# Patient Record
Sex: Female | Born: 1990 | Race: White | Marital: Married | Smoking: Never smoker
Health system: Southern US, Community
[De-identification: ages and names within clinical notes are randomized; demographics above are authoritative.]

## PROBLEM LIST (undated history)

## (undated) HISTORY — PX: WISDOM TOOTH EXTRACTION: SHX21

---

## 2018-12-25 ENCOUNTER — Other Ambulatory Visit: Payer: Self-pay | Admitting: Obstetrics and Gynecology

## 2018-12-25 DIAGNOSIS — Z369 Encounter for antenatal screening, unspecified: Secondary | ICD-10-CM

## 2018-12-26 ENCOUNTER — Other Ambulatory Visit: Payer: Self-pay

## 2018-12-26 ENCOUNTER — Telehealth: Payer: Self-pay | Admitting: Obstetrics and Gynecology

## 2018-12-26 DIAGNOSIS — O30039 Twin pregnancy, monochorionic/diamniotic, unspecified trimester: Secondary | ICD-10-CM

## 2018-12-26 NOTE — Telephone Encounter (Signed)
I spoke with Tracey Logan about her visit to [facility] scheduled for Monday, 12/30/2018.  She was referred for genetic counseling to review screening and testing options followed by an ultrasound and MFM consultation due to the finding of mono/di twins on her recent ultrasound.  She stated that she and her partner have spoken about the genetic testing and do not want to pursue any screening.  She also denied any family history or pregnancy history concerns that she would like to discuss.  Therefore, she asked to decline the genetic counseling visit and just come in for the ultrasound and MFM visit.  We may be reached at [phone] if anything changes.  Wilburt Finlay, MS, CGC

## 2018-12-30 ENCOUNTER — Ambulatory Visit
Admission: RE | Admit: 2018-12-30 | Discharge: 2018-12-30 | Disposition: A | Discharge status: Home / Self Care | Payer: BC Managed Care – PPO | Source: Ambulatory Visit | Attending: Obstetrics and Gynecology | Admitting: Obstetrics and Gynecology

## 2018-12-30 ENCOUNTER — Other Ambulatory Visit: Payer: Self-pay

## 2018-12-30 ENCOUNTER — Ambulatory Visit: Payer: Self-pay

## 2018-12-30 DIAGNOSIS — Z3A09 9 weeks gestation of pregnancy: Secondary | ICD-10-CM | POA: Insufficient documentation

## 2018-12-30 DIAGNOSIS — O30031 Twin pregnancy, monochorionic/diamniotic, first trimester: Secondary | ICD-10-CM | POA: Diagnosis present

## 2018-12-30 DIAGNOSIS — O210 Mild hyperemesis gravidarum: Secondary | ICD-10-CM | POA: Diagnosis not present

## 2018-12-30 DIAGNOSIS — O30039 Twin pregnancy, monochorionic/diamniotic, unspecified trimester: Secondary | ICD-10-CM | POA: Insufficient documentation

## 2018-12-30 DIAGNOSIS — Z8759 Personal history of other complications of pregnancy, childbirth and the puerperium: Secondary | ICD-10-CM | POA: Insufficient documentation

## 2018-12-30 NOTE — Progress Notes (Signed)
[facility] Consultation   Chief Complaint: MO/Di Twins   HPI: Ms. Tracey Logan is a 28 y.o. G3P0020 at 9 weeks 2 days by earliest ultrasound performed [facility] on 12/19/2018 at 7 weeks 5 days by the larger crown-rump length, her last menstrual period was irregular as she had had a recent miscarriage. She  presents in consultation from  Prenatal clinic due to her diagnosis of monochorionic diamniotic twin gestation. 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  She reports that she is felt well except for some nausea in the evening for which she has been using doxylamine. She loves horses and riding and participates in mounted shooting as a sport.   She is taking supplemental vitamins and folic acid. She is interested in nutrition during pregnancy as she advises farms on animal nutrition.    Past Medical History: Patient  has no past medical history on file.  Patient denies diabetes hypertension or asthma Past Surgical History: She  has a past surgical history that includes Wisdom tooth extraction (Bilateral).  Obstetric History:  Gravida 3  1-SAB at 6 weeks required misoprostol 2 SAB at 5 weeks in June 2020 Gynecologic History:  Patient's last menstrual period was 10/22/2018.    Medications: She @CMEDP @  Allergies: Patient has No Known Allergies.  Social History: Patient  reports that she has never smoked. She has never used smokeless tobacco. She reports that she does not drink alcohol or use drugs.  Family History: family history is not on file.  Patient states her parents are alive and well she has a sister who has no children Review of Systems mild nausea in the evening Physical Exam: LMP 10/22/2018  Height is 5 foot 8 Weight 186  pounds Blood pressure 111/55 Pulse is 80 Respiratory rate 20 Pulse ox 100  Well appearing WF   Asessement: Mono/ di twins at 9 weeks 2 days History of SAB x2 Mild nausea on doxylamine Declines genetic screening Plan: I reviewed the risk of twins in general including: -Prematurity-I reviewed that there are no evidence-based interventions for preterm birth for twins. I Told the risk of delivery prior to 37 weeks was at least 20%. -Fetal growth restriction-told her the risk was increased and that the pregnancy would need monitoring with ultrasound of the third trimester to monitor fetal growth. -Hyperemesis I advised the patient that if the doxylamine is not working she could use Phenergan .  Zofran is less preferred alternative because of some data suggesting slight increased risk of birth defects, but if she experiences metabolic derangement then reasonable to use Zofran. -Preeclampsia-I advised the patient to initiate a daily baby aspirin 11- 14 weeks due to increased risk of preeclampsia with twins -Gestational diabetes-patient is mildly overweight but has no family history of diabetes I recommended a routine Glucola screening test at 28 weeks.  I reviewed specific risks of monozygotic twinning with monochorionic/diamniotic placentation -I reviewed the concerns with twin to twin transfusion syndrome and the high risk of fetal morbidity and mortality associated with it.  I recommended every 2-week ultrasound starting at 12 weeks alternating with growth every 4 weeks.  I reviewed the challenges of intervention for his complication -Higher risk of congenital anomalies-recommended detailed ultrasound at 18 weeks, with  Covid we have not been routinely performing fetal echo there is any challenging imaging the heart I would suggest this. -Higher risk of IUFD-at 32 weeks I recommend weekly monitoring with delivery at 37 weeks. We briefly discussed methods of delivery depending on presentation and  status of the fetuses.   Total time spent with the patient was 30 minutes with greater than 50% spent in counseling and coordination of care. We appreciate this interesting consult and will be happy to be involved in the ongoing care of Tracey Logan in anyway her obstetricians desire.  Jacquis Paxton, [facility]

## 2019-01-08 LAB — OB RESULTS CONSOLE GC/CHLAMYDIA
Chlamydia: NEGATIVE
Gonorrhea: NEGATIVE

## 2019-01-08 LAB — OB RESULTS CONSOLE VARICELLA ZOSTER ANTIBODY, IGG: Varicella: IMMUNE

## 2019-01-08 LAB — OB RESULTS CONSOLE RUBELLA ANTIBODY, IGM: Rubella: IMMUNE

## 2019-01-08 LAB — OB RESULTS CONSOLE HEPATITIS B SURFACE ANTIGEN: Hepatitis B Surface Ag: NEGATIVE

## 2019-01-08 LAB — OB RESULTS CONSOLE HIV ANTIBODY (ROUTINE TESTING): HIV: NONREACTIVE

## 2019-01-14 ENCOUNTER — Other Ambulatory Visit: Payer: Self-pay | Admitting: Obstetrics and Gynecology

## 2019-01-14 DIAGNOSIS — O30001 Twin pregnancy, unspecified number of placenta and unspecified number of amniotic sacs, first trimester: Secondary | ICD-10-CM

## 2019-01-16 ENCOUNTER — Ambulatory Visit: Payer: Self-pay

## 2019-01-27 ENCOUNTER — Ambulatory Visit
Admission: RE | Admit: 2019-01-27 | Discharge: 2019-01-27 | Disposition: A | Discharge status: Home / Self Care | Payer: BC Managed Care – PPO | Source: Ambulatory Visit | Attending: Obstetrics and Gynecology | Admitting: Obstetrics and Gynecology

## 2019-01-27 ENCOUNTER — Other Ambulatory Visit: Payer: Self-pay

## 2019-01-27 DIAGNOSIS — O30001 Twin pregnancy, unspecified number of placenta and unspecified number of amniotic sacs, first trimester: Secondary | ICD-10-CM | POA: Diagnosis not present

## 2019-01-27 DIAGNOSIS — Z369 Encounter for antenatal screening, unspecified: Secondary | ICD-10-CM | POA: Insufficient documentation

## 2019-01-27 DIAGNOSIS — Z3A13 13 weeks gestation of pregnancy: Secondary | ICD-10-CM | POA: Diagnosis not present

## 2019-01-27 NOTE — Progress Notes (Signed)
Pt declined to return for TTTS checks at [facility] - she will have u/s done at [facility] - more convenient and better support for her.   I scheduled her for her anatomy scan a t [facility] .  Gatha Mayer

## 2019-03-07 NOTE — L&D Delivery Note (Signed)
Operative Delivery Note At  a viable female x 2 was delivered via kielland forceps + svd .  Presentation: VTX/ VTX  Presentation  BAby A - LOT , Baby B -AO ; Position: ; Station: +3/3  Verbal consent: obtained from patient.  Risks and benefits discussed in detail.  Risks include, but are not limited to the risks of anesthesia, bleeding, infection, damage to maternal tissues, fetal cephalhematoma.  There is also the risk of inability to effect vaginal delivery of the head, or shoulder dystocia that cannot be resolved by established maneuvers, leading to the need for emergency cesarean section. Baby A delivered via ILF kielland with 90 % rotation  Baby B after 15-20 minutes AROM with copious amniotic fluid and manual reduction of fetal hand to side of head . Pushing allowed head to be only presenting part . SVD by [facility], CNM with delivery of placentas manually. Uterine swipe without retained POC . 1 gm IV ancef for prophylaxis . Second degree repair done after rectal exam to ensure no capsule rupture or rectal mucosal injury   See dictated report  APGAR:A Baby A: 8/9  Baby B : apgars 8/9 , ; weightA: #6/4 , B: #5/8  .   Placenta status: Manual extraction - intact , .   Cord: #v x 2  with the following complications: None .   Anesthesia:  CLE + lidocaine  Instruments: Kielland forceps  Episiotomy:  none Lacerations:  second  Suture Repair: 2.0 3.0 vicryl Est. Blood Loss (mL):  600 cc  Mom to postpartum.  Baby to Couplet care / Skin to Skin.  Tracey Logan 07/14/2019, 5:19 PM

## 2019-05-19 ENCOUNTER — Other Ambulatory Visit: Payer: Self-pay | Admitting: Maternal & Fetal Medicine

## 2019-05-19 ENCOUNTER — Other Ambulatory Visit: Payer: Self-pay

## 2019-05-19 ENCOUNTER — Ambulatory Visit
Admission: RE | Admit: 2019-05-19 | Discharge: 2019-05-19 | Disposition: A | Discharge status: Home / Self Care | Payer: BC Managed Care – PPO | Source: Ambulatory Visit | Attending: Maternal & Fetal Medicine | Admitting: Maternal & Fetal Medicine

## 2019-05-19 DIAGNOSIS — Z3A29 29 weeks gestation of pregnancy: Secondary | ICD-10-CM | POA: Diagnosis not present

## 2019-05-19 DIAGNOSIS — O30002 Twin pregnancy, unspecified number of placenta and unspecified number of amniotic sacs, second trimester: Secondary | ICD-10-CM

## 2019-05-29 ENCOUNTER — Other Ambulatory Visit: Payer: Self-pay | Admitting: Maternal & Fetal Medicine

## 2019-05-29 DIAGNOSIS — O30033 Twin pregnancy, monochorionic/diamniotic, third trimester: Secondary | ICD-10-CM

## 2019-06-02 ENCOUNTER — Ambulatory Visit
Admission: RE | Admit: 2019-06-02 | Discharge: 2019-06-02 | Disposition: A | Discharge status: Home / Self Care | Payer: BC Managed Care – PPO | Source: Ambulatory Visit | Attending: Obstetrics and Gynecology | Admitting: Obstetrics and Gynecology

## 2019-06-02 ENCOUNTER — Other Ambulatory Visit: Payer: Self-pay | Admitting: Maternal & Fetal Medicine

## 2019-06-02 ENCOUNTER — Other Ambulatory Visit: Payer: Self-pay

## 2019-06-02 ENCOUNTER — Other Ambulatory Visit: Payer: Self-pay | Admitting: Obstetrics and Gynecology

## 2019-06-02 DIAGNOSIS — O30033 Twin pregnancy, monochorionic/diamniotic, third trimester: Secondary | ICD-10-CM

## 2019-06-02 DIAGNOSIS — Z3A00 Weeks of gestation of pregnancy not specified: Secondary | ICD-10-CM | POA: Diagnosis not present

## 2019-06-09 ENCOUNTER — Other Ambulatory Visit: Payer: Self-pay

## 2019-06-09 ENCOUNTER — Other Ambulatory Visit: Payer: Self-pay | Admitting: Maternal & Fetal Medicine

## 2019-06-09 ENCOUNTER — Ambulatory Visit
Admission: RE | Admit: 2019-06-09 | Discharge: 2019-06-09 | Disposition: A | Discharge status: Home / Self Care | Payer: BC Managed Care – PPO | Source: Ambulatory Visit | Attending: Maternal & Fetal Medicine | Admitting: Maternal & Fetal Medicine

## 2019-06-09 DIAGNOSIS — O30033 Twin pregnancy, monochorionic/diamniotic, third trimester: Secondary | ICD-10-CM

## 2019-06-09 DIAGNOSIS — Z3A32 32 weeks gestation of pregnancy: Secondary | ICD-10-CM | POA: Diagnosis not present

## 2019-06-16 ENCOUNTER — Other Ambulatory Visit: Payer: Self-pay

## 2019-06-16 ENCOUNTER — Ambulatory Visit
Admission: RE | Admit: 2019-06-16 | Discharge: 2019-06-16 | Disposition: A | Discharge status: Home / Self Care | Payer: BC Managed Care – PPO | Source: Ambulatory Visit | Attending: Obstetrics and Gynecology | Admitting: Obstetrics and Gynecology

## 2019-06-16 ENCOUNTER — Other Ambulatory Visit: Payer: Self-pay | Admitting: Obstetrics and Gynecology

## 2019-06-16 DIAGNOSIS — Z3A33 33 weeks gestation of pregnancy: Secondary | ICD-10-CM | POA: Diagnosis not present

## 2019-06-16 DIAGNOSIS — O365990 Maternal care for other known or suspected poor fetal growth, unspecified trimester, not applicable or unspecified: Secondary | ICD-10-CM | POA: Diagnosis present

## 2019-06-16 DIAGNOSIS — O30033 Twin pregnancy, monochorionic/diamniotic, third trimester: Secondary | ICD-10-CM | POA: Insufficient documentation

## 2019-06-16 DIAGNOSIS — O30039 Twin pregnancy, monochorionic/diamniotic, unspecified trimester: Secondary | ICD-10-CM | POA: Diagnosis present

## 2019-06-19 ENCOUNTER — Other Ambulatory Visit: Payer: Self-pay | Admitting: Obstetrics and Gynecology

## 2019-06-19 DIAGNOSIS — O30033 Twin pregnancy, monochorionic/diamniotic, third trimester: Secondary | ICD-10-CM

## 2019-06-23 ENCOUNTER — Ambulatory Visit
Admission: RE | Admit: 2019-06-23 | Discharge: 2019-06-23 | Disposition: A | Discharge status: Home / Self Care | Payer: BC Managed Care – PPO | Source: Ambulatory Visit | Attending: Obstetrics and Gynecology | Admitting: Obstetrics and Gynecology

## 2019-06-23 ENCOUNTER — Other Ambulatory Visit: Payer: Self-pay

## 2019-06-23 DIAGNOSIS — O30033 Twin pregnancy, monochorionic/diamniotic, third trimester: Secondary | ICD-10-CM | POA: Insufficient documentation

## 2019-06-23 DIAGNOSIS — Z3A34 34 weeks gestation of pregnancy: Secondary | ICD-10-CM | POA: Insufficient documentation

## 2019-06-25 ENCOUNTER — Other Ambulatory Visit: Payer: Self-pay | Admitting: Maternal & Fetal Medicine

## 2019-06-25 ENCOUNTER — Observation Stay
Admission: RE | Admit: 2019-06-25 | Discharge: 2019-06-25 | Disposition: A | Discharge status: Home / Self Care | Payer: BC Managed Care – PPO | Attending: Obstetrics and Gynecology | Admitting: Obstetrics and Gynecology

## 2019-06-25 ENCOUNTER — Encounter: Payer: Self-pay | Admitting: Obstetrics and Gynecology

## 2019-06-25 DIAGNOSIS — O0990 Supervision of high risk pregnancy, unspecified, unspecified trimester: Secondary | ICD-10-CM | POA: Diagnosis not present

## 2019-06-25 DIAGNOSIS — O30033 Twin pregnancy, monochorionic/diamniotic, third trimester: Secondary | ICD-10-CM

## 2019-06-25 DIAGNOSIS — Z3A34 34 weeks gestation of pregnancy: Secondary | ICD-10-CM | POA: Insufficient documentation

## 2019-06-25 DIAGNOSIS — Z79899 Other long term (current) drug therapy: Secondary | ICD-10-CM | POA: Insufficient documentation

## 2019-06-25 LAB — OB RESULTS CONSOLE RPR: RPR: NONREACTIVE

## 2019-06-25 LAB — OB RESULTS CONSOLE GBS: GBS: NEGATIVE

## 2019-06-25 NOTE — OB Triage Note (Signed)
Patient presents to labor and delivery for scheduled NST. Patient reports no contractions, leaking of fluid, and states she has good fetal movement of both babies. Vital signs are stable. CNM aware of patient arrival and in to assess patient.

## 2019-06-25 NOTE — Discharge Summary (Signed)
Tracey Logan is a 29 y.o. female. She is at [redacted]w[redacted]d gestation. Patient's last menstrual period was 10/22/2018. Estimated Date of Delivery: 08/06/19   Prenatal care site: [facility]   Chief Complaint: high risk pregnancy, need for antepartum surveillance for Mono-Di twin pregnancy  S: Resting comfortably. no CTX, no VB.no LOF,  Active fetal movement.    Maternal Medical History:  No past medical history on file.  Past Surgical History:  Procedure Laterality Date  . WISDOM TOOTH EXTRACTION Bilateral     No Known Allergies  Prior to Admission medications   Medication Sig Start Date End Date Taking? Authorizing Provider  Ascorbic Acid (VITAMIN C) 1000 MG tablet Take 1,000 mg by mouth daily.    [provider]  docusate sodium (COLACE) 100 MG capsule Take 100 mg by mouth daily.    [provider]  ferrous sulfate 325 (65 FE) MG tablet Take 325 mg by mouth daily with breakfast.    [provider]  Prenatal Vit-Fe Fumarate-FA (PRENATAL MULTIVITAMIN) TABS tablet Take 2 tablets by mouth daily at 12 noon.    [provider]     Social History: She  reports that she has never smoked. She has never used smokeless tobacco. She reports that she does not drink alcohol or use drugs.  Family History:  no history of gyn cancers  Review of Systems: A full review of systems was performed and negative except as noted in the HPI.     O:  BP 110/74 (BP Location: Left Arm)   Pulse (!) 107   Temp 98.2 F (36.8 C) (Oral)   Resp 17   Ht 5\' 8"  (1.727 m)   Wt 95 kg   LMP 10/22/2018   BMI 31.85 kg/m  No results found for this or any previous visit (from the past 48 hour(s)).   Constitutional: NAD, AAOx3  HE/ENT: extraocular movements grossly intact, moist mucous membranes CV: RRR PULM: nl respiratory effort, CTABL     Abd: gravid, non-tender, non-distended, soft      Ext: Non-tender, Nonedmeatous   Psych: mood appropriate, speech  normal Pelvic: deferred  Twin A Baseline: 140bpm Variability: moderate Accelerations present x >2 Decelerations absent  Twin B Baseline: 140bpm Variability: moderate Accelerations present x >2 Decelerations absent  Toco: no UCs  Time 10/24/2018    A/P: 29 y.o. [redacted]w[redacted]d with high risk pregnancy and antepartum surveillance.   Labor: not present.   Fetal Wellbeing: Reassuring Cat 1 tracing x 2.  Reactive NST   D/c home stable, precautions reviewed, follow-up as scheduled.   ----- [redacted]w[redacted]d, CNM 06/25/2019  5:10 PM

## 2019-06-26 ENCOUNTER — Other Ambulatory Visit: Payer: Self-pay | Admitting: Maternal & Fetal Medicine

## 2019-06-26 DIAGNOSIS — O30033 Twin pregnancy, monochorionic/diamniotic, third trimester: Secondary | ICD-10-CM

## 2019-06-30 ENCOUNTER — Ambulatory Visit
Admission: RE | Admit: 2019-06-30 | Discharge: 2019-06-30 | Disposition: A | Discharge status: Home / Self Care | Payer: BC Managed Care – PPO | Source: Ambulatory Visit | Attending: Obstetrics and Gynecology | Admitting: Obstetrics and Gynecology

## 2019-06-30 ENCOUNTER — Other Ambulatory Visit: Payer: Self-pay

## 2019-06-30 DIAGNOSIS — O30033 Twin pregnancy, monochorionic/diamniotic, third trimester: Secondary | ICD-10-CM | POA: Diagnosis present

## 2019-06-30 DIAGNOSIS — Z3A35 35 weeks gestation of pregnancy: Secondary | ICD-10-CM | POA: Diagnosis not present

## 2019-06-30 NOTE — ED Notes (Signed)
Pt to appt at [facility] this morning with husband.  Pt reported that she started contracting some last night after working hard with 2 of her horses that were ill.  Pt reported mild contractions like every hour.  No fluid leaking or bleeding. Having [place] now and will put pt on the monitor per MFM request.

## 2019-07-03 ENCOUNTER — Other Ambulatory Visit: Payer: Self-pay | Admitting: Maternal & Fetal Medicine

## 2019-07-03 ENCOUNTER — Other Ambulatory Visit: Payer: Self-pay | Admitting: Obstetrics and Gynecology

## 2019-07-03 DIAGNOSIS — O30033 Twin pregnancy, monochorionic/diamniotic, third trimester: Secondary | ICD-10-CM

## 2019-07-07 ENCOUNTER — Other Ambulatory Visit: Payer: Self-pay

## 2019-07-07 ENCOUNTER — Ambulatory Visit
Admission: RE | Admit: 2019-07-07 | Discharge: 2019-07-07 | Disposition: A | Discharge status: Home / Self Care | Payer: BC Managed Care – PPO | Source: Ambulatory Visit | Attending: Maternal & Fetal Medicine | Admitting: Maternal & Fetal Medicine

## 2019-07-07 DIAGNOSIS — O30033 Twin pregnancy, monochorionic/diamniotic, third trimester: Secondary | ICD-10-CM | POA: Insufficient documentation

## 2019-07-07 DIAGNOSIS — Z3A36 36 weeks gestation of pregnancy: Secondary | ICD-10-CM | POA: Insufficient documentation

## 2019-07-11 ENCOUNTER — Other Ambulatory Visit: Payer: Self-pay

## 2019-07-11 ENCOUNTER — Observation Stay
Admission: RE | Admit: 2019-07-11 | Discharge: 2019-07-11 | Disposition: A | Discharge status: Home / Self Care | Payer: BC Managed Care – PPO | Source: Home / Self Care | Attending: Obstetrics and Gynecology | Admitting: Obstetrics and Gynecology

## 2019-07-11 DIAGNOSIS — O30043 Twin pregnancy, dichorionic/diamniotic, third trimester: Secondary | ICD-10-CM | POA: Insufficient documentation

## 2019-07-11 DIAGNOSIS — Z79899 Other long term (current) drug therapy: Secondary | ICD-10-CM | POA: Insufficient documentation

## 2019-07-11 DIAGNOSIS — O30049 Twin pregnancy, dichorionic/diamniotic, unspecified trimester: Secondary | ICD-10-CM | POA: Diagnosis present

## 2019-07-11 DIAGNOSIS — Z3A36 36 weeks gestation of pregnancy: Secondary | ICD-10-CM | POA: Insufficient documentation

## 2019-07-11 NOTE — OB Triage Note (Signed)
Pt G4P0, [redacted]w[redacted]d, presents to birthplace for scheduled NST for twin pregnancy. Pt denies LOF, vag bleeding, pain, or contractions. Reports good fetal movement. Monitors applied and assessing.

## 2019-07-11 NOTE — Discharge Summary (Signed)
Tracey Logan is a 29 y.o. female. She is at 100w2d gestation. Patient's last menstrual period was 10/22/2018. Estimated Date of Delivery: 08/06/19  Prenatal care site: [facility]   Chief Complaint: high risk pregnancy, need for antepartum surveillance   S: Resting comfortably. no CTX, no VB.no LOF,  Active fetal movement.    Maternal Medical History:  Past Medical Hx: Medical history reviewed, no pertinent history   Past Surgical Hx:  has a past surgical history that includes Wisdom tooth extraction (Bilateral).   No Known Allergies  Prior to Admission medications   Medication Sig Start Date End Date Taking? Authorizing Provider  docusate sodium (COLACE) 100 MG capsule Take 100 mg by mouth daily.   Yes [provider]  ferrous sulfate 325 (65 FE) MG tablet Take 325 mg by mouth daily with breakfast.   Yes [provider]  Prenatal Vit-Fe Fumarate-FA (PRENATAL MULTIVITAMIN) TABS tablet Take 2 tablets by mouth daily at 12 noon.   Yes [provider]  Ascorbic Acid (VITAMIN C) 1000 MG tablet Take 1,000 mg by mouth daily.    [provider]     Social History: She  reports that she has never smoked. She has never used smokeless tobacco. She reports that she does not drink alcohol or use drugs.  Family History: Family history reviewed, no pertinent history, no history of gyn cancers  Review of Systems: A full review of systems was performed and negative except as noted in the HPI.    O:  BP (!) 116/58 (BP Location: Left Arm)   Pulse 92   Temp 98 F (36.7 C) (Oral)   Resp 18   LMP 10/22/2018  No results found for this or any previous visit (from the past 48 hour(s)).   Constitutional: NAD, AAOx3  CV: RRR PULM: nl respiratory effort   Psych: mood appropriate, speech normal Pelvic: deferred   NST for di/di twin gestation   NST: Baby A Baseline: 145 Variability: moderate Accelerations present x >2 Decelerations  absent Time  NST: Baby B Baseline: 145 Variability: moderate Accelerations present x >2 Decelerations absent Time   A/P: 29 y.o. [redacted]w[redacted]d with high risk pregnancy and antepartum surveillance.   Labor: not present.   Fetal Wellbeing: Reassuring Cat 1 tracing.  Reactive NST for baby A and B  D/c home stable, precautions reviewed, follow-up as scheduled.   ----- Margaretmary Eddy, CNM Certified Nurse Midwife [facility]

## 2019-07-13 ENCOUNTER — Other Ambulatory Visit: Payer: Self-pay | Admitting: Certified Nurse Midwife

## 2019-07-13 ENCOUNTER — Encounter: Payer: Self-pay | Admitting: Certified Nurse Midwife

## 2019-07-13 NOTE — Progress Notes (Signed)
  [name] is a 29 y.o. [id number] female dated by [redacted]w[redacted]d ultrasound.    Pregnancy Issues: 1. Rubella non-immune 2. Mono-di twins: 07/07/2019 u/s vertex/vertex presentations; 06/30/2019 Baby A EFW 2490g 5lb8oz 35%, Baby B EFW 2401g 5lb5oz 27%, discordance 4% WNL; 03/13/2019 MFM anatomy scan with velamentous placental cord insertion for Twin B, no evidence of vasa previa 3. 01/2019 ASCUS pap with hrHPV negative 4. COVID-19 illness during pregnancy, 02/2019 5. Anemia on iron supplementation 6. Elevated 1h OGTT, 3h OGTT WNL 7. Heart palpitations, bradycardia, tachycardia during pregnancy. Eval by cardiology negative.    Prenatal care site: [facility]    Prenatal Labs: Blood type/Rh O+  Antibody screen neg  Rubella Non-immune  Varicella Immune  RPR NR  HBsAg Neg  HIV NR  GC neg  Chlamydia neg  Genetic screening Declined  1 hour GTT 152  3 hour GTT   GBS negative     Tdap: 05/15/2019 Flu: declined

## 2019-07-14 ENCOUNTER — Inpatient Hospital Stay: Payer: BC Managed Care – PPO | Admitting: Anesthesiology

## 2019-07-14 ENCOUNTER — Inpatient Hospital Stay: Payer: BC Managed Care – PPO | Admitting: Registered Nurse

## 2019-07-14 ENCOUNTER — Other Ambulatory Visit: Payer: Self-pay

## 2019-07-14 ENCOUNTER — Inpatient Hospital Stay
Admission: EM | Admit: 2019-07-14 | Discharge: 2019-07-16 | DRG: 806 | Disposition: A | Discharge status: Home / Self Care | Payer: BC Managed Care – PPO | Attending: Certified Nurse Midwife | Admitting: Certified Nurse Midwife

## 2019-07-14 ENCOUNTER — Encounter: Payer: Self-pay | Admitting: Obstetrics and Gynecology

## 2019-07-14 ENCOUNTER — Encounter
Admission: EM | Disposition: A | Discharge status: Home / Self Care | Payer: Self-pay | Source: Home / Self Care | Attending: Certified Nurse Midwife

## 2019-07-14 DIAGNOSIS — O9081 Anemia of the puerperium: Secondary | ICD-10-CM | POA: Diagnosis not present

## 2019-07-14 DIAGNOSIS — O326XX2 Maternal care for compound presentation, fetus 2: Secondary | ICD-10-CM | POA: Diagnosis present

## 2019-07-14 DIAGNOSIS — Z3490 Encounter for supervision of normal pregnancy, unspecified, unspecified trimester: Secondary | ICD-10-CM | POA: Diagnosis present

## 2019-07-14 DIAGNOSIS — D62 Acute posthemorrhagic anemia: Secondary | ICD-10-CM | POA: Diagnosis not present

## 2019-07-14 DIAGNOSIS — Z8616 Personal history of COVID-19: Secondary | ICD-10-CM | POA: Diagnosis not present

## 2019-07-14 DIAGNOSIS — Z20822 Contact with and (suspected) exposure to covid-19: Secondary | ICD-10-CM | POA: Diagnosis present

## 2019-07-14 DIAGNOSIS — O30033 Twin pregnancy, monochorionic/diamniotic, third trimester: Principal | ICD-10-CM | POA: Diagnosis present

## 2019-07-14 DIAGNOSIS — Z3A37 37 weeks gestation of pregnancy: Secondary | ICD-10-CM | POA: Diagnosis not present

## 2019-07-14 LAB — CBC
HCT: 31 % — ABNORMAL LOW (ref 36.0–46.0)
Hemoglobin: 10.8 g/dL — ABNORMAL LOW (ref 12.0–15.0)
MCH: 31.8 pg (ref 26.0–34.0)
MCHC: 34.8 g/dL (ref 30.0–36.0)
MCV: 91.2 fL (ref 80.0–100.0)
Platelets: 185 10*3/uL (ref 150–400)
RBC: 3.4 MIL/uL — ABNORMAL LOW (ref 3.87–5.11)
RDW: 14.8 % (ref 11.5–15.5)
WBC: 7.2 10*3/uL (ref 4.0–10.5)
nRBC: 0 % (ref 0.0–0.2)

## 2019-07-14 LAB — RPR: RPR Ser Ql: NONREACTIVE

## 2019-07-14 LAB — RESPIRATORY PANEL BY RT PCR (FLU A&B, COVID)
Influenza A by PCR: NEGATIVE
Influenza B by PCR: NEGATIVE
SARS Coronavirus 2 by RT PCR: NEGATIVE

## 2019-07-14 LAB — TYPE AND SCREEN
ABO/RH(D): O POS
Antibody Screen: NEGATIVE

## 2019-07-14 SURGERY — Surgical Case
Anesthesia: Epidural

## 2019-07-14 MED ORDER — FENTANYL CITRATE (PF) 100 MCG/2ML IJ SOLN
INTRAMUSCULAR | Status: DC | PRN
  Administered 2019-07-14: 50 ug via INTRAVENOUS

## 2019-07-14 MED ORDER — OXYTOCIN 40 UNITS IN NORMAL SALINE INFUSION - SIMPLE MED
1.0000 m[IU]/min | INTRAVENOUS | Status: DC
  Administered 2019-07-14: 2 m[IU]/min via INTRAVENOUS

## 2019-07-14 MED ORDER — PHENYLEPHRINE 40 MCG/ML (10ML) SYRINGE FOR IV PUSH (FOR BLOOD PRESSURE SUPPORT): 80.0000 ug | PREFILLED_SYRINGE | INTRAVENOUS | Status: DC | PRN

## 2019-07-14 MED ORDER — ACETAMINOPHEN 325 MG PO TABS
650.0000 mg | ORAL_TABLET | ORAL | Status: DC | PRN
  Administered 2019-07-14 – 2019-07-15 (×2): 650 mg via ORAL
  Filled 2019-07-14 (×2): qty 2

## 2019-07-14 MED ORDER — CEFAZOLIN SODIUM-DEXTROSE 1-4 GM/50ML-% IV SOLN
INTRAVENOUS | Status: DC | PRN
Start: 2019-07-14 — End: 2019-07-14
  Administered 2019-07-14: 1 g via INTRAVENOUS

## 2019-07-14 MED ORDER — PRENATAL MULTIVITAMIN CH
1.0000 | ORAL_TABLET | Freq: Every day | ORAL | Status: DC
  Administered 2019-07-15 – 2019-07-16 (×2): 1 via ORAL
  Filled 2019-07-14 (×2): qty 1

## 2019-07-14 MED ORDER — TERBUTALINE SULFATE 1 MG/ML IJ SOLN: 0.2500 mg | Freq: Once | INTRAMUSCULAR | Status: DC | PRN

## 2019-07-14 MED ORDER — MEASLES, MUMPS & RUBELLA VAC IJ SOLR
0.5000 mL | Freq: Once | INTRAMUSCULAR | Status: DC
  Filled 2019-07-14: qty 0.5

## 2019-07-14 MED ORDER — LACTATED RINGERS IV SOLN
500.0000 mL | Freq: Once | INTRAVENOUS | Status: AC
  Administered 2019-07-14: 500 mL via INTRAVENOUS

## 2019-07-14 MED ORDER — BENZOCAINE-MENTHOL 20-0.5 % EX AERO
1.0000 "application " | INHALATION_SPRAY | CUTANEOUS | Status: DC | PRN
  Administered 2019-07-16: 1 via TOPICAL
  Filled 2019-07-14 (×2): qty 56

## 2019-07-14 MED ORDER — MISOPROSTOL 200 MCG PO TABS
ORAL_TABLET | ORAL | Status: AC
  Filled 2019-07-14: qty 4

## 2019-07-14 MED ORDER — SOD CITRATE-CITRIC ACID 500-334 MG/5ML PO SOLN: 30.0000 mL | ORAL | Status: DC | PRN

## 2019-07-14 MED ORDER — SENNOSIDES-DOCUSATE SODIUM 8.6-50 MG PO TABS
2.0000 | ORAL_TABLET | ORAL | Status: DC
  Administered 2019-07-15 – 2019-07-16 (×2): 2 via ORAL
  Filled 2019-07-14 (×2): qty 2

## 2019-07-14 MED ORDER — EPHEDRINE 5 MG/ML INJ: 10.0000 mg | INTRAVENOUS | Status: DC | PRN

## 2019-07-14 MED ORDER — FENTANYL CITRATE (PF) 100 MCG/2ML IJ SOLN
INTRAMUSCULAR | Status: AC
  Filled 2019-07-14: qty 2

## 2019-07-14 MED ORDER — OXYTOCIN 40 UNITS IN NORMAL SALINE INFUSION - SIMPLE MED
2.5000 [IU]/h | INTRAVENOUS | Status: DC
  Filled 2019-07-14: qty 1000

## 2019-07-14 MED ORDER — IBUPROFEN 600 MG PO TABS
600.0000 mg | ORAL_TABLET | Freq: Four times a day (QID) | ORAL | Status: DC
  Administered 2019-07-14 – 2019-07-16 (×8): 600 mg via ORAL
  Filled 2019-07-14 (×7): qty 1

## 2019-07-14 MED ORDER — DIPHENHYDRAMINE HCL 50 MG/ML IJ SOLN: 12.5000 mg | INTRAMUSCULAR | Status: DC | PRN

## 2019-07-14 MED ORDER — BUTORPHANOL TARTRATE 1 MG/ML IJ SOLN: 1.0000 mg | INTRAMUSCULAR | Status: DC | PRN

## 2019-07-14 MED ORDER — AMMONIA AROMATIC IN INHA
RESPIRATORY_TRACT | Status: AC
  Filled 2019-07-14: qty 10

## 2019-07-14 MED ORDER — FENTANYL 2.5 MCG/ML W/ROPIVACAINE 0.15% IN NS 100 ML EPIDURAL (ARMC)
EPIDURAL | Status: AC
  Filled 2019-07-14: qty 100

## 2019-07-14 MED ORDER — ONDANSETRON HCL 4 MG PO TABS: 4.0000 mg | ORAL_TABLET | ORAL | Status: DC | PRN

## 2019-07-14 MED ORDER — LIDOCAINE HCL (PF) 1 % IJ SOLN
30.0000 mL | INTRAMUSCULAR | Status: AC | PRN
  Administered 2019-07-14 (×2): 1 mL via SUBCUTANEOUS

## 2019-07-14 MED ORDER — LIDOCAINE HCL (PF) 1 % IJ SOLN
INTRAMUSCULAR | Status: AC
  Filled 2019-07-14: qty 30

## 2019-07-14 MED ORDER — ZOLPIDEM TARTRATE 5 MG PO TABS: 5.0000 mg | ORAL_TABLET | Freq: Every evening | ORAL | Status: DC | PRN

## 2019-07-14 MED ORDER — MISOPROSTOL 25 MCG QUARTER TABLET
25.0000 ug | ORAL_TABLET | ORAL | Status: DC | PRN
  Administered 2019-07-14: 01:00:00 25 ug via VAGINAL
  Filled 2019-07-14: qty 1

## 2019-07-14 MED ORDER — SODIUM CHLORIDE 0.9 % IV SOLN
INTRAVENOUS | Status: DC | PRN
  Administered 2019-07-14 (×2): 5 mL via EPIDURAL

## 2019-07-14 MED ORDER — ONDANSETRON HCL 4 MG/2ML IJ SOLN
4.0000 mg | Freq: Four times a day (QID) | INTRAMUSCULAR | Status: DC | PRN
  Administered 2019-07-14: 4 mg via INTRAVENOUS
  Filled 2019-07-14: qty 2

## 2019-07-14 MED ORDER — ONDANSETRON HCL 4 MG/2ML IJ SOLN: 4.0000 mg | INTRAMUSCULAR | Status: DC | PRN

## 2019-07-14 MED ORDER — DIBUCAINE (PERIANAL) 1 % EX OINT
1.0000 "application " | TOPICAL_OINTMENT | CUTANEOUS | Status: DC | PRN
  Administered 2019-07-15 – 2019-07-16 (×2): 1 via RECTAL
  Filled 2019-07-14 (×2): qty 28

## 2019-07-14 MED ORDER — CEFAZOLIN SODIUM 1 G IJ SOLR
INTRAMUSCULAR | Status: AC
  Filled 2019-07-14: qty 10

## 2019-07-14 MED ORDER — LIDOCAINE-EPINEPHRINE (PF) 1.5 %-1:200000 IJ SOLN
INTRAMUSCULAR | Status: DC | PRN
  Administered 2019-07-14: 3 mL via EPIDURAL

## 2019-07-14 MED ORDER — DIPHENHYDRAMINE HCL 25 MG PO CAPS: 25.0000 mg | ORAL_CAPSULE | Freq: Four times a day (QID) | ORAL | Status: DC | PRN

## 2019-07-14 MED ORDER — SIMETHICONE 80 MG PO CHEW: 80.0000 mg | CHEWABLE_TABLET | ORAL | Status: DC | PRN

## 2019-07-14 MED ORDER — WITCH HAZEL-GLYCERIN EX PADS
1.0000 "application " | MEDICATED_PAD | CUTANEOUS | Status: DC | PRN
  Administered 2019-07-15 – 2019-07-16 (×2): 1 via TOPICAL
  Filled 2019-07-14 (×2): qty 100

## 2019-07-14 MED ORDER — LACTATED RINGERS IV SOLN: INTRAVENOUS | Status: DC

## 2019-07-14 MED ORDER — LIDOCAINE HCL (PF) 2 % IJ SOLN
INTRAMUSCULAR | Status: AC
  Filled 2019-07-14: qty 10

## 2019-07-14 MED ORDER — LACTATED RINGERS IV SOLN
500.0000 mL | INTRAVENOUS | Status: DC | PRN
  Administered 2019-07-14: 12:00:00 500 mL via INTRAVENOUS

## 2019-07-14 MED ORDER — OXYTOCIN BOLUS FROM INFUSION
500.0000 mL | Freq: Once | INTRAVENOUS | Status: AC
  Administered 2019-07-14: 500 mL via INTRAVENOUS

## 2019-07-14 MED ORDER — COCONUT OIL OIL
1.0000 "application " | TOPICAL_OIL | Status: DC | PRN
  Administered 2019-07-15: 1 via TOPICAL
  Filled 2019-07-14: qty 120

## 2019-07-14 MED ORDER — MISOPROSTOL 25 MCG QUARTER TABLET
25.0000 ug | ORAL_TABLET | ORAL | Status: DC | PRN
  Administered 2019-07-14: 01:00:00 25 ug via BUCCAL
  Filled 2019-07-14: qty 1

## 2019-07-14 MED ORDER — OXYTOCIN 10 UNIT/ML IJ SOLN
INTRAMUSCULAR | Status: AC
  Filled 2019-07-14: qty 2

## 2019-07-14 MED ORDER — LIDOCAINE HCL (PF) 2 % IJ SOLN
INTRAMUSCULAR | Status: DC | PRN
  Administered 2019-07-14: 200 mg via EPIDURAL

## 2019-07-14 MED ORDER — SODIUM CHLORIDE (PF) 0.9 % IJ SOLN
INTRAMUSCULAR | Status: AC
  Filled 2019-07-14: qty 10

## 2019-07-14 MED ORDER — FENTANYL 2.5 MCG/ML W/ROPIVACAINE 0.15% IN NS 100 ML EPIDURAL (ARMC)
12.0000 mL/h | EPIDURAL | Status: DC
  Administered 2019-07-14 (×2): 12 mL/h via EPIDURAL
  Filled 2019-07-14: qty 100

## 2019-07-14 MED ORDER — ACETAMINOPHEN 325 MG PO TABS: 650.0000 mg | ORAL_TABLET | ORAL | Status: DC | PRN

## 2019-07-14 SURGICAL SUPPLY — 27 items
BARRIER ADHS 3X4 INTERCEED (GAUZE/BANDAGES/DRESSINGS) IMPLANT
CANISTER SUCT 3000ML PPV (MISCELLANEOUS) IMPLANT
CHLORAPREP W/TINT 26 (MISCELLANEOUS) IMPLANT
COVER WAND RF STERILE (DRAPES) IMPLANT
DRSG TELFA 3X8 NADH (GAUZE/BANDAGES/DRESSINGS) IMPLANT
ELECT CAUTERY BLADE 6.4 (BLADE) IMPLANT
ELECT REM PT RETURN 9FT ADLT (ELECTROSURGICAL)
ELECTRODE REM PT RTRN 9FT ADLT (ELECTROSURGICAL) IMPLANT
GAUZE SPONGE 4X4 12PLY STRL (GAUZE/BANDAGES/DRESSINGS) IMPLANT
GLOVE SURG SYN 8.0 (GLOVE) IMPLANT
GOWN STRL REUS W/ TWL LRG LVL3 (GOWN DISPOSABLE) IMPLANT
GOWN STRL REUS W/ TWL XL LVL3 (GOWN DISPOSABLE) IMPLANT
GOWN STRL REUS W/TWL LRG LVL3 (GOWN DISPOSABLE)
GOWN STRL REUS W/TWL XL LVL3 (GOWN DISPOSABLE)
NS IRRIG 1000ML POUR BTL (IV SOLUTION) IMPLANT
PACK C SECTION (MISCELLANEOUS) IMPLANT
PAD OB MATERNITY 4.3X12.25 (PERSONAL CARE ITEMS) IMPLANT
PAD PREP 24X41 OB/GYN DISP (PERSONAL CARE ITEMS) IMPLANT
PENCIL SMOKE ULTRAEVAC 22 CON (MISCELLANEOUS) IMPLANT
RETRACTOR TRAXI PANNICULUS (MISCELLANEOUS) IMPLANT
STAPLER INSORB 30 2030 C-SECTI (MISCELLANEOUS) IMPLANT
STRAP SAFETY 5IN WIDE (MISCELLANEOUS) IMPLANT
SUCT VACUUM KIWI BELL (SUCTIONS) IMPLANT
SUT CHROMIC 1 CTX 36 (SUTURE) IMPLANT
SUT PLAIN GUT 0 (SUTURE) IMPLANT
SUT VIC AB 0 CT1 36 (SUTURE) IMPLANT
TRAXI PANNICULUS RETRACTOR (MISCELLANEOUS)

## 2019-07-14 NOTE — Discharge Summary (Signed)
Obstetrical Discharge Summary  Patient Name: Tracey Logan DOB: [birth year] MRN: [id number]  Date of Admission: 07/14/2019 Date of Delivery: 07/14/19 Delivered by: Schermerhorn MD and McVey CNM Date of Discharge: 07/16/2019  Primary OB: [place]  RSW:[id number] last menstrual period was 10/22/2018. EDC Estimated Date of Delivery: 08/02/19 Gestational Age at Delivery: [redacted]w[redacted]d   Antepartum complications:  1.Rubella non-immune 2. Mono-di twins: 07/07/2019 u/s vertex/vertex presentations; 06/30/2019 Baby A EFW 2490g 5lb8oz 35%, Baby B EFW 2401g 5lb5oz 27%, discordance 4% WNL; 03/13/2019 MFM anatomy scan with velamentous placental cord insertion for Twin B, no evidence of vasa previa 3. 01/2019 ASCUS pap with hrHPV negative 4. COVID-19 illness during pregnancy, 02/2019 5. Anemia on iron supplementation 6. Elevated 1h OGTT, 3h OGTT WNL 7. Heart palpitations, bradycardia, tachycardia during pregnancy. Eval by cardiology negative.  Admitting Diagnosis: twins Mono-Di at 37.2wks Secondary Diagnosis: Forceps assist delivery, manual delivery of placenta, 2nd deg perineal lac  Patient Active Problem List   Diagnosis Date Noted  . Encounter for planned induction of labor 07/14/2019  . Twin pregnancy, twins dichorionic and diamniotic 07/11/2019  . Twin pregnancy, monochorionic/diamniotic, third trimester 06/25/2019  . Maternal care for restricted fetal growth, antepartum 06/16/2019  . Monochorionic diamniotic twin gestation 12/30/2018  . History of spontaneous abortion x2 12/30/2018    Augmentation: AROM, Pitocin and Cytotec Complications: None Intrapartum complications/course: single dose of cytotec for IOL, then augment with AROM and Pitocin. Progressed to C/C/+1; Pushed well with baby A but noted to be transverse position, forceps applied by Dr Ouida Sills for rotation and delivery of baby A. Baby B in vertex presentation, AROM by Dr Ouida Sills, pt pushed with several contractions  and SVD of Baby B.  Date of Delivery: 07/14/19 Delivered By:  Schermerhorn MD and McVey CNM Delivery Type: forceps, low for baby A, SVD baby B Anesthesia: epidural Placenta: manual Laceration: 2nd deg perineal Episiotomy: none Newborn Data:   Tracey, Logan [[id number]]  Live born female  Birth Weight: 6 lb 4.2 oz (2840 g) APGAR: 8, 9  Newborn Delivery   Birth date/time: 07/14/2019 16:22:00 Delivery type: Vaginal, Forceps       Tracey, Logan [[id number]]  Live born female  Birth Weight: 5 lb 8.2 oz (2500 g) APGAR: 8, 9  Newborn Delivery   Birth date/time: 07/14/2019 16:36:00 Delivery type: Vaginal, Spontaneous      Postpartum Procedures: none  Edinburgh:  Edinburgh Postnatal Depression Scale Screening Tool 07/15/2019 07/14/2019  I have been able to laugh and see the funny side of things. 0 (No Data)  I have looked forward with enjoyment to things. 0 -  I have blamed myself unnecessarily when things went wrong. 0 -  I have been anxious or worried for no good reason. 0 -  I have felt scared or panicky for no good reason. 1 -  Things have been getting on top of me. 1 -  I have been so unhappy that I have had difficulty sleeping. 0 -  I have felt sad or miserable. 0 -  I have been so unhappy that I have been crying. 0 -  The thought of harming myself has occurred to me. 0 -  Edinburgh Postnatal Depression Scale Total 2 -      Post partum course:  Patient had an uncomplicated postpartum course.  By time of discharge on PPD#2, her pain was controlled on oral pain medications; she had appropriate lochia and was ambulating, voiding without difficulty and tolerating regular diet.  She was deemed  stable for discharge to home.     Discharge Physical Exam:  BP 124/78 (BP Location: Left Arm)   Pulse 93   Temp 98.3 F (36.8 C) (Oral)   Resp 18   Ht 5\' 8"  (1.727 m)   Wt 99.3 kg   LMP 10/22/2018   SpO2 100%   Breastfeeding Unknown   BMI 33.30 kg/m    General: NAD CV: RRR Pulm: CTABL, nl effort ABD: s/nd/nt, fundus firm and below the umbilicus Lochia: moderate DVT Evaluation: LE non-ttp, no evidence of DVT on exam.  Hemoglobin  Date Value Ref Range Status  07/16/2019 8.0 (L) 12.0 - 15.0 g/dL Final   HCT  Date Value Ref Range Status  07/16/2019 23.6 (L) 36.0 - 46.0 % Final     Disposition: stable, discharge to home. Baby Feeding: breastmilk & formula Baby Disposition: home with mom  Rh Immune globulin given: n/a Rubella vaccine given: ordered for administration prior to discharge Varicella vaccine given: immune Tdap vaccine given in AP or PP setting: given 05/15/19 Flu vaccine given in AP or PP setting: declined  Contraception: TBD  Prenatal Labs:  Blood type/Rh O+  Antibody screen neg  Rubella Non-immune  Varicella Immune  RPR NR  HBsAg Neg  HIV NR  GC neg  Chlamydia neg  Genetic screening declined  1 hour GTT 152  3 hour GTT 83, 147, 105, 111  GBS negative      Plan:  Tracey Logan was discharged to home in good condition. Follow-up appointment with delivering provider in 6 weeks.  Discharge Medications: Allergies as of 07/16/2019   No Known Allergies     Medication List    TAKE these medications   acetaminophen 500 MG tablet Commonly known as: Tylenol Take 2 tablets (1,000 mg total) by mouth every 6 (six) hours as needed for mild pain or moderate pain.   docusate sodium 100 MG capsule Commonly known as: COLACE Take 100 mg by mouth daily.   ferrous sulfate 325 (65 FE) MG tablet Take 325 mg by mouth daily with breakfast.   ibuprofen 600 MG tablet Commonly known as: ADVIL Take 1 tablet (600 mg total) by mouth every 6 (six) hours as needed for mild pain, moderate pain or cramping.   prenatal multivitamin Tabs tablet Take 2 tablets by mouth daily at 12 noon.   vitamin C 1000 MG tablet Take 1,000 mg by mouth daily.        Signed: 09/15/2019, CNM 07/16/2019  8:02  AM

## 2019-07-14 NOTE — Op Note (Signed)
NAME: Tracey Logan, Tracey Logan MEDICAL RECORD DH:[id number] ACCOUNT [id number] DATE OF BIRTH:03-27-1990 FACILITY: [facility] LOCATION: [facility] PHYSICIAN:Casmira Cramer Cloyde Reams, MD  OPERATIVE REPORT  DATE OF PROCEDURE:  07/14/2019  PREOPERATIVE DIAGNOSES: 1.  37+2 weeks estimated gestational age. 2.  Twin gestation monochorionic/diamniotic placentation. 3.  Baby A left occiput transverse, deep arrest.  POSTOPERATIVE DIAGNOSES: 1. 37+2 weeks estimated gestational age. 2.  Monochorionic/diamniotic twin gestation. 3.  Left occiput transverse, deep arrest, baby A. 4.  Compound presentation of baby B.  PROCEDURE: 1.  Indicated low forceps delivery with 90-degree rotation, Kielland forceps. 2.  Spontaneous vaginal delivery, baby B. 3.  Manual placental extraction. 4.  Repair of second-degree laceration.  SURGEON:  Suzy Bouchard, MD  FIRST ASSISTANT:  Heloise Ochoa, certified nurse midwife.  INDICATIONS:  A 29 year old gravida 3, para 0 patient at 37+2 weeks estimated gestational age with known twin gestation, monochorionic/diamniotic placentation.  The patient was brought in the day of the procedure and her labor was induced with Cytotec.   The patient progressed to 9 cm without difficulty and was taken to the operating room for a double setup.  Fetal presentation vertex and vertex based on an ultrasound earlier in the day.  The patient progressed to complete and complete and +3 station  after 75 minutes of vigorous pushing.  Baby A's head was documented to be in the left occiput transverse position.  This was verified by a bedside ultrasound.  Fetal station +3/3.  I obtained verbal consent from the patient and the husband for a Kielland  forceps rotational delivery.  The patient and the husband gave verbal consent.  DESCRIPTION OF PROCEDURE:  The bladder was drained with a red Robinson catheter, yielding 50 mL of urine.  Kielland forceps were brought up to the operative field and  the anterior blade was placed in the classic fashion.  The posterior blade was placed  without difficulty and the arms articulated without difficulty.  Fetal head was then rotated 90 degrees counterclockwise.  The forceps position was rechecked while keeping the blades applied to the fetal head and with the next set of maternal pushes, the  fetal head was delivered.  The blades were removed before the full head was delivered through the perineum.  Fetal shoulders and body were then delivered without difficulty and the infant was placed on the mother's abdomen.  After 60 seconds, the cord  was doubly clamp.  Time of birth 1622 hours on 07/14/2019.  Fetal weight 6 pounds 4 ounces and the Apgars were 8 and 9.  One clamp was placed on the remaining umbilical cord.  An ultrasound was brought up to the bedside and again verified vertex  presentation.  This was confirmed with the manual check as well.  A fetal hand was noted to be next to the fetal head for baby B.  The patient was allowed to labor down for 15-20 minutes with Pitocin augmentation.  When the head was to 0 station and with  a contraction, amniotomy was performed with a large amount of clear fluid.  I then placed a hand into the vagina and to the fetal head and manually reduced the hand that was up against the fetal head.  The mother was allowed to push.  With 2 attempts,  the head was well applied and there was no other fetal part that was documented.  Several pushes later, the mother delivered the fetal head.  Heloise Ochoa, certified nurse midwife then performed a vaginal delivery, which was uncomplicated.  A vigorous  female  baby B was born at 1636 hours and was placed on the patient's abdomen and cord was doubly clamped after 60 seconds.  Apgar scores of 8 and 9, fetal weight 5 pounds 8 ounces.  A manual placental extraction was performed by me and an intrauterine  sweep ensured that there were no retained products of conception.  The patient  did receive 1 g of IV Ancef for prophylaxis.  IV Pitocin was administered with good uterine tone.  There was a second-degree perineal laceration, which was repaired in  standard fashion.  Good approximation of tissues.  COMPLICATIONS:  There were no complications.  ESTIMATED BLOOD LOSS:  600 mL.  DISPOSITION:  The patient was taken to recovery room in good condition.  The patient tolerated the procedure well.  VN/NUANCE  D:07/14/2019 T:07/14/2019 JOB:[id number]

## 2019-07-14 NOTE — H&P (Signed)
OB History & Physical   History of Present Illness:  Chief Complaint: presents for scheduled IOL  HPI:  Tracey Logan is a 29 y.o. G33P0020 female at [redacted]w[redacted]d dated by [redacted]w[redacted]d ultrasound.  She presents to L&D for scheduled IOL  She reports:  -active fetal movement -no leakage of fluid -no vaginal bleeding -some irregular contractions  Pregnancy Issues: 1. Rubella non-immune 2. Mono-di twins: 07/07/2019 u/s vertex/vertex presentations; 06/30/2019 Baby A EFW 2490g 5lb8oz 35%, Baby B EFW 2401g 5lb5oz 27%, discordance 4% WNL; 03/13/2019 MFM anatomy scan with velamentous placental cord insertion for Twin B, no evidence of vasa previa 3. 01/2019 ASCUS pap with hrHPV negative 4. COVID-19 illness during pregnancy, 02/2019 5. Anemia on iron supplementation 6. Elevated 1h OGTT, 3h OGTT WNL 7. Heart palpitations, bradycardia, tachycardia during pregnancy. Eval by cardiology negative.    Maternal Medical History:  No past medical history on file.  Past Surgical History:  Procedure Laterality Date  . WISDOM TOOTH EXTRACTION Bilateral     No Known Allergies  Prior to Admission medications   Medication Sig Start Date End Date Taking? Authorizing Provider  Ascorbic Acid (VITAMIN C) 1000 MG tablet Take 1,000 mg by mouth daily.    [provider]  docusate sodium (COLACE) 100 MG capsule Take 100 mg by mouth daily.    [provider]  ferrous sulfate 325 (65 FE) MG tablet Take 325 mg by mouth daily with breakfast.    [provider]  Prenatal Vit-Fe Fumarate-FA (PRENATAL MULTIVITAMIN) TABS tablet Take 2 tablets by mouth daily at 12 noon.    [provider]     Prenatal care site: [facility]   Social History: She  reports that she has never smoked. She has never used smokeless tobacco. She reports that she does not drink alcohol or use drugs.  Family History: family history includes Healthy in her father and mother.   Review of Systems: A full  review of systems was performed and negative except as noted in the HPI.    Physical Exam:  Vital Signs: LMP 10/22/2018   General:   alert, cooperative, appears stated age and no distress  Skin:  normal and no rash or abnormalities  Neurologic:    Alert & oriented x 3  Lungs:   clear to auscultation bilaterally  Heart:   regular rate and rhythm, S1, S2 normal, no murmur, click, rub or gallop  Abdomen:  soft, non-tender; bowel sounds normal; no masses,  no organomegaly  Pelvis:  Exam deferred.  FHT:  Baby A: 140 BPM, Baby B: 140bpm  Presentations: cephalic, cephalic by bedside ultrasound  Cervix:  deferred to RN   Extremities: : non-tender, symmetric, no edema bilaterally.     No results found for this or any previous visit (from the past 24 hour(s)).  Pertinent Results:  Prenatal Labs: Blood type/Rh O+  Antibody screen neg  Rubella Non-immune  Varicella Immune  RPR NR  HBsAg Neg  HIV NR  GC neg  Chlamydia neg  Genetic screening declined  1 hour GTT 152  3 hour GTT 83, 147, 105, 111  GBS negative   FHT: Baby A FHR: 140 bpm, variability: moderate,  accelerations:  Present,  decelerations:  Absent; Baby B FHR: 140 bpm, variability: moderate,  accelerations:  Present,  decelerations:  Absent Category/reactivity:  Category I TOCO: occasional contractions  [place] MFM OB FOLLOW UP  Result Date: 06/30/2019 ----------------------------------------------------------------------  OBSTETRICS REPORT                       (  Signed Final 06/30/2019 09:56 am) ---------------------------------------------------------------------- PATIENT INFO:  ID #:       [id number]                          D.O.B.:  1990-11-02 (28 yrs)  Name:       Tracey Logan [facility]             Visit Date: 06/30/2019 09:11 am ---------------------------------------------------------------------- PERFORMED BY:  Performed By:     Verne Grain            Referred By:      REBECCA A                    Sonographer                               MCVEY ---------------------------------------------------------------------- SERVICE(S) PROVIDED:   [place] MFM OB FOLLOW UP                                  [id number]  ---------------------------------------------------------------------- INDICATIONS:   [redacted] weeks gestation of pregnancy                Z3A.35  ---------------------------------------------------------------------- FETAL EVALUATION (FETUS A):  Num Of Fetuses:         2  Fetal Heart Rate(bpm):  141  Cardiac Activity:       Present  Fetal Lie:              Maternal Left  Presentation:           Vertex  Placenta:               Fundal Grade  2 and 3, No previa  Amniotic Fluid  AFI FV:      Within normal limits                              Largest Pocket(cm)                              2.7 ---------------------------------------------------------------------- BIOPHYSICAL EVALUATION (FETUS A):  Amniotic F.V:   Within normal limits       F. Tone:        Observed  F. Movement:    Observed                   Score:          8/8  F. Breathing:   Observed ---------------------------------------------------------------------- BIOMETRY (FETUS A):  BPD:      86.5  mm     G. Age:  34w 6d         42  %    CI:        76.04   %    70 - 86                                                          FL/HC:      21.8   %    20.1 - 22.3  HC:  314.4  mm     G. Age:  35w 2d         19  %    HC/AC:      1.04        0.93 - 1.11  AC:       303   mm     G. Age:  34w 2d         28  %    FL/BPD:     79.2   %    71 - 87  FL:       68.5  mm     G. Age:  35w 1d         41  %    FL/AC:      22.6   %    20 - 24  HUM:      61.2  mm     G. Age:  35w 3d         68  %  Est. FW:    2490  gm      5 lb 8 oz     35  %     FW Discordancy      0 \ 4 % ---------------------------------------------------------------------- GESTATIONAL AGE (FETUS A):  LMP:           35w 6d        Date:  10/22/18                 EDD:   07/29/19  U/S Today:     34w 6d                                        EDD:    08/05/19  Best:          35w 2d     Det. By:  Marcella Dubs         EDD:   08/02/19 ---------------------------------------------------------------------- DOPPLER - FETAL VESSELS (FETUS A):  Umbilical Artery   S/D     %tile   2.9       72 ---------------------------------------------------------------------- FETAL EVALUATION (FETUS B):  Num Of Fetuses:         2  Fetal Heart Rate(bpm):  141  Cardiac Activity:       Present  Fetal Lie:              Maternal Right  Presentation:           Vertex  Placenta:               Fundal Grade 2 and 3, No previa  Amniotic Fluid  AFI FV:      Within normal limits                              Largest Pocket(cm)                              5.1 ---------------------------------------------------------------------- BIOPHYSICAL EVALUATION (FETUS B):  Amniotic F.V:   Within normal limits       F. Tone:        Observed  F. Movement:    Observed                   Score:  8/8  F. Breathing:   Observed ---------------------------------------------------------------------- BIOMETRY (FETUS B):  BPD:      83.4  mm     G. Age:  33w 4d         11  %    CI:        76.41   %    70 - 86                                                          FL/HC:      22.6   %    20.1 - 22.3  HC:      302.3  mm     G. Age:  33w 4d        < 3  %    HC/AC:      1.00        0.93 - 1.11  AC:      302.2  mm     G. Age:  34w 2d         26  %    FL/BPD:     82.0   %    71 - 87  FL:       68.4  mm     G. Age:  35w 1d         40  %    FL/AC:      22.6   %    20 - 24  HUM:      60.5  mm     G. Age:  35w 1d         60  %  Est. FW:    2401  gm      5 lb 5 oz     27  %     FW Discordancy         4  % ---------------------------------------------------------------------- GESTATIONAL AGE (FETUS B):  LMP:           35w 6d        Date:  10/22/18                 EDD:   07/29/19  U/S Today:     34w 1d                                        EDD:   08/10/19  Best:          35w 2d     Det. By:  Loman Chroman          EDD:   08/02/19 ---------------------------------------------------------------------- DOPPLER - FETAL VESSELS (FETUS B):  Umbilical Artery   S/D     %tile   2.9       72 ---------------------------------------------------------------------- IMPRESSION:  Dear Ms MCVEY  ,  Thank you for referring your patient  for ultrasound for twin  growth evaluation.  A Mono/Di twin pregnancy is again identified at 35w 2d by  earliest scan performed at [facility] clinic on 12/19/2018 at  [place].  A thin dividing membrane is once again identified.  Twin A is located on the maternal    left   side (single posterior  placenta).  Twin A is cephalic.  Limited follow up anatomy appears  normal.  EFW is at the 35%ile  The MVP for Twin A is 3.4   cm.  BPP 8/8  Twin B is located on the maternal    right   side (single  posterior placenta).    Twin B is cephalic.  Limited follow up anatomy appears  normal.  EFW 27th %ile  The MVP for Twin B is  5.2 cm.   BPP 8/8  Both twins are appropriately grown today .  The fetal weight discordance measured within normal limits.  The amniotic fluid is within normal limits in each sac.  I recommend twice weekly monitoring.  Dopplers are no longer recommended given normal interval  growth.  Patient has a scheduled induction.  She complained of contractions today - NST performed  REactive x 2 occasional irritability no UCs.  precautions given for going to L&D  Thank you for allowing [place] to participate in your patient's care.  Please do not hesitate to contact us with any questions. ----------------------------------------------------------------------              Jimmey Ralph, MD Electronically Signed Final Report   06/30/2019 09:56 am ----------------------------------------------------------------------  [place] MFM OB FOLLOW UP  Result Date: 06/16/2019 ----------------------------------------------------------------------  OBSTETRICS REPORT                       (Signed Final 06/16/2019 12:30 pm)  ---------------------------------------------------------------------- PATIENT INFO:  ID #:       [id number]                          D.O.B.:  17-Dec-1990 (28 yrs)  Name:       Tracey Logan [facility]             Visit Date: 06/16/2019 11:16 am ---------------------------------------------------------------------- PERFORMED BY:  Performed By:     Ceasar Mons          Referred By:      REBECCA A                    Sonographer                              MCVEY ---------------------------------------------------------------------- SERVICE(S) PROVIDED:   [place] MFM OB FOLLOW UP                                  40981.19  ---------------------------------------------------------------------- INDICATIONS:   [redacted] weeks gestation of pregnancy                Z3A.33  ---------------------------------------------------------------------- FETAL EVALUATION (FETUS A):  Num Of Fetuses:         2  Fetal Heart Rate(bpm):  160  Cardiac Activity:       Present  Presentation:           Cephalic head maternal left  Placenta:               Posterior                              Largest Pocket(cm)                              3.6 ---------------------------------------------------------------------- BIOPHYSICAL EVALUATION (FETUS  A):  Amniotic F.V:   Pocket => 2 cm two         F. Tone:        Observed                  planes  F. Movement:    Observed                   Score:          8/8  F. Breathing:   Observed ---------------------------------------------------------------------- BIOMETRY (FETUS A):  BPD:      82.1  mm     G. Age:  33w 0d         36  %    CI:        75.47   %    70 - 86                                                          FL/HC:      20.9   %    19.9 - 21.5  HC:      299.7  mm     G. Age:  33w 2d         15  %    HC/AC:      1.06        0.96 - 1.11  AC:       283   mm     G. Age:  32w 2d         24  %    FL/BPD:     76.4   %    71 - 87  FL:       62.7  mm     G. Age:  32w 3d         20  %    FL/AC:      22.2   %    20 - 24   HUM:      56.6  mm     G. Age:  32w 6d         50  %  Est. FW:    1991  gm      4 lb 6 oz     25  %     FW Discordancy     0 \ 11 % ---------------------------------------------------------------------- GESTATIONAL AGE (FETUS A):  LMP:           33w 6d        Date:  10/22/18                 EDD:   07/29/19  U/S Today:     32w 5d                                        EDD:   08/06/19  Best:          33w 2d     Det. By:  Marcella DubsEarly Ultrasound         EDD:   08/02/19 ---------------------------------------------------------------------- ANATOMY (FETUS A):  Cranium:               Within Normal Limits   Kidneys:  Within Normal Limits  Stomach:               Seen                   Bladder:                Seen  Abdomen:               Normal appearance ---------------------------------------------------------------------- DOPPLER - FETAL VESSELS (FETUS A):  Umbilical Artery   S/D     %tile     RI    %tile  2.59       50   0.61        55 ---------------------------------------------------------------------- FETAL EVALUATION (FETUS B):  Num Of Fetuses:         2  Fetal Heart Rate(bpm):  147  Cardiac Activity:       Present  Presentation:           Cephalic head maternal right  Placenta:               Posterior                              Largest Pocket(cm)                              4.6 ---------------------------------------------------------------------- BIOPHYSICAL EVALUATION (FETUS B):  Amniotic F.V:   Pocket => 2 cm two         F. Tone:        Observed                  planes  F. Movement:    Observed                   Score:          8/8  F. Breathing:   Observed ---------------------------------------------------------------------- BIOMETRY (FETUS B):  BPD:      77.7  mm     G. Age:  31w 1d          3  %    CI:        73.75   %    70 - 86                                                          FL/HC:      20.7   %    19.9 - 21.5  HC:      287.4  mm     G. Age:  31w 4d        < 3  %    HC/AC:      1.04         0.96 - 1.11  AC:      276.7  mm     G. Age:  31w 5d         13  %    FL/BPD:     76.7   %    71 - 87  FL:       59.6  mm     G. Age:  31w 0d        < 3  %  FL/AC:      21.5   %    20 - 24  HUM:      53.9  mm     G. Age:  31w 2d         20  %  Est. FW:    1772  gm    3 lb 15 oz       7  %     FW Discordancy        11  % ---------------------------------------------------------------------- GESTATIONAL AGE (FETUS B):  LMP:           33w 6d        Date:  10/22/18                 EDD:   07/29/19  U/S Today:     31w 3d                                        EDD:   08/15/19  Best:          33w 2d     Det. By:  Marcella Dubs         EDD:   08/02/19 ---------------------------------------------------------------------- ANATOMY (FETUS B):  Cranium:               Normal appearance      Kidneys:                Within Normal Limits  Stomach:               Seen                   Bladder:                Seen  Abdomen:               Normal appearance ---------------------------------------------------------------------- DOPPLER - FETAL VESSELS (FETUS B):  Umbilical Artery   S/D     %tile     RI    %tile  2.41       40   0.59        47 ---------------------------------------------------------------------- CERVIX UTERUS ADNEXA:  Cervix  Appears closed ---------------------------------------------------------------------- IMPRESSION:  Dear  MCVEY  ,  Thank you for referring your patient for ultrasound for twin  fetal growth and BPP.  A Monochorionic twin pregnancy is again identified. She is  currently at 33w 2d by earliest ultrasound done at [facility]  clinic on 12/19/18 at 7w 5d  .  A thin dividing membrane is once again identified.  Twin A is located on the maternal left  side (single fundal  posterior placenta).  Twin B is located on the maternal right    side (single fundal  posterior placenta).  Twin A is cephalic.  EFW 1991g 25%ile  Twin B is cephalic.  EFW 1772g 7th %ile ( AC at the 13th)  The fetal weight discordance  measured within normal limits-  11%  The amniotic fluid is within normal limits in each sac.  The MVP for Twin A is  3.1 cm.  doppler S/D of midcord 2.59  The MVP for Twin B is   2.8 cm. doppler s/d of midcord 2.41  The BPP's were 8/8 x 2 which was reassuring.  Follow-up ultrasound is suggested in 2-3  weeks to evaluate  the fetal growth.  I recommend antenatal testing  twice weekly :  Follow-up BPP with dopplers is suggested in 1 week,  alternating  with NSTs weekly ( or we can do BPP at [facility] )  Pt is currently scheduled for IOL on 07/14/19, if growth lag  persists consider moving up induction date to 36-37 weeks .  Thank you for allowing [place] to participate in your patient's care.  Please do not hesitate to contact us with any questions. ----------------------------------------------------------------------              Jimmey Ralph, MD Electronically Signed Final Report   06/16/2019 12:30 pm ----------------------------------------------------------------------  [place] MFM UA DOPPLER ADDL GEST RE EVAL  Result Date: 06/23/2019 ----------------------------------------------------------------------  OBSTETRICS REPORT                       (Signed Final 06/23/2019 03:30 pm) ---------------------------------------------------------------------- PATIENT INFO:  ID #:       [id number]                          D.O.B.:  [birth year] (28 yrs)  Name:       Tracey Logan [facility]             Visit Date: 06/23/2019 03:04 pm ---------------------------------------------------------------------- PERFORMED BY:  Performed By:     Ceasar Mons          Referred By:      REBECCA A                    Sonographer                              MCVEY ---------------------------------------------------------------------- SERVICE(S) PROVIDED:   [place] MFM UA DOPPLER ADDL GEST RE EVAL                  40981.19  ---------------------------------------------------------------------- INDICATIONS:   [redacted] weeks gestation of pregnancy                 Z3A.34   Mono/Di Twins   FGR Twin B   BPP without NST and cord Dopplers  ---------------------------------------------------------------------- FETAL EVALUATION (FETUS A):  Num Of Fetuses:         2  Fetal Heart Rate(bpm):  162  Cardiac Activity:       Present  Presentation:           Cephalic maternal left  Placenta:               Posterior, fundal                              Largest Pocket(cm)                              3.4 ---------------------------------------------------------------------- BIOPHYSICAL EVALUATION (FETUS A):  Amniotic F.V:   Within normal limits       F. Tone:        Observed  F. Movement:    Observed                   Score:          8/8  F. Breathing:   Observed ---------------------------------------------------------------------- GESTATIONAL AGE (FETUS A):  LMP:           34w 6d  Date:  10/22/18                 EDD:   07/29/19  Best:          34w 2d     Det. By:  Marcella Dubs         EDD:   08/02/19 ---------------------------------------------------------------------- DOPPLER - FETAL VESSELS (FETUS A):  Umbilical Artery   S/D     %tile   2.1       25 ---------------------------------------------------------------------- FETAL EVALUATION (FETUS B):  Num Of Fetuses:         2  Fetal Heart Rate(bpm):  141  Cardiac Activity:       Present  Presentation:           Cephalic maternal right  Placenta:               Posterior, fundal                              Largest Pocket(cm)                              4.5 ---------------------------------------------------------------------- BIOPHYSICAL EVALUATION (FETUS B):  Amniotic F.V:   Within normal limits       F. Tone:        Observed  F. Movement:    Observed                   Score:          8/8  F. Breathing:   Observed ---------------------------------------------------------------------- GESTATIONAL AGE (FETUS B):  LMP:           34w 6d        Date:  10/22/18                 EDD:   07/29/19  Best:          34w 2d     Det.  By:  Marcella Dubs         EDD:   08/02/19 ---------------------------------------------------------------------- DOPPLER - FETAL VESSELS (FETUS B):  Umbilical Artery   S/D     %tile     2       18 ---------------------------------------------------------------------- IMPRESSION:  Intrauterine twin pregnancy with a best estimated gestational  age of [redacted] weeks 2 days.  Dating is based on earliest  available ultrasound performed at [facility] on  12/19/2018; measurements were reported as 7 weeks 5 days.  Placentation has been previously documented as  monochorionic diamniotic.  Twin A:  cephalic, maternal left.  MVP 3.4 cm.  Mean  umbilical cord Doppler s/d ratio is 2.1 (normal).  BPP 8/8.  Twin B:  cephalic, maternal right.  MVP 4.5 cm.  Mean  umbilical cord Doppler s/d ratio is 2 (normal).  BPP 8/8.  NST is scheduled for Wednesday (per the patient).  Twin  growth and TTTS check is scheduled for 4/26. ----------------------------------------------------------------------                  Kirby Funk, MD Electronically Signed Final Report   06/23/2019 03:30 pm ----------------------------------------------------------------------  [place] MFM FETAL BPP WO NST ADDL GESTATION  Result Date: 07/07/2019 ----------------------------------------------------------------------  OBSTETRICS REPORT                       (Signed Final 07/07/2019 11:43 am) ---------------------------------------------------------------------- PATIENT INFO:  ID #:  [id number]                          D.O.B.:  Oct 07, 1990 (28 yrs)  Name:       Tracey Logan [facility]             Visit Date: 07/07/2019 10:35 am ---------------------------------------------------------------------- PERFORMED BY:  Performed By:     Neita Carp RDMS        Referred By:      REBECCA A                                                             MCVEY ---------------------------------------------------------------------- SERVICE(S) PROVIDED:   [place] MFM FETAL BPP WO NST  ADDL GESTATION               40981.1  ---------------------------------------------------------------------- INDICATIONS:   [redacted] weeks gestation of pregnancy                Z3A.36  ---------------------------------------------------------------------- FETAL EVALUATION (FETUS A):  Num Of Fetuses:         2  Fetal Heart Rate(bpm):  150  Cardiac Activity:       Present  Presentation:           Vertex  Placenta:               Posterior  AFI Sum(cm)     %Tile       Largest Pocket(cm)  4.66            < 3         4.66  RUQ(cm)  4.66 ---------------------------------------------------------------------- BIOPHYSICAL EVALUATION (FETUS A):  Amniotic F.V:   Within normal limits       F. Tone:        Observed  F. Movement:    Observed                   Score:          8/8  F. Breathing:   Observed ---------------------------------------------------------------------- GESTATIONAL AGE (FETUS A):  LMP:           36w 6d        Date:  10/22/18                 EDD:   07/29/19  Best:          Stevie Kern 2d     Det. By:  Marcella Dubs         EDD:   08/02/19 ---------------------------------------------------------------------- FETAL EVALUATION (FETUS B):  Num Of Fetuses:         2  Fetal Heart Rate(bpm):  155  Cardiac Activity:       Present  Presentation:           Vertex  Placenta:               Posterior  AFI Sum(cm)     %Tile       Largest Pocket(cm)  4.29            < 3         4.29  RUQ(cm)  4.29 ---------------------------------------------------------------------- BIOPHYSICAL EVALUATION (FETUS B):  Amniotic F.V:   Within normal limits       F. Tone:  Observed  F. Movement:    Observed                   Score:          8/8  F. Breathing:   Observed ---------------------------------------------------------------------- GESTATIONAL AGE (FETUS B):  LMP:           36w 6d        Date:  10/22/18                 EDD:   07/29/19  Best:          Stevie Kern 2d     Det. By:  Marcella Dubs         EDD:   08/02/19  ---------------------------------------------------------------------- IMPRESSION:  Dear Colleagues,  Thank you for referring your patient for antental testing due to  mono/di twin gestation at 36 weeks 2 days.  Dating is by  earliest scan perofrmed at [facility] clinic on 12/19/19 at 7  weeks 5 days.  A thin dividing membrane is once again identified.  maternal  bp is 114/74 today.  Twin A is located on the maternal left     side; a single,  posterior placenta is noted.  Twin B is located on the maternal  rigtht side;  a single  posterior placenta is noted  Twin A is cephalic.  Twin B is cephalic.  The amniotic fluid is within normal limits in each sac.  The MVP for Twin A is  4.6 cm.  The MVP for Twin B is  4.2  cm.  The BPP's were 8/8 x 2, which was reassuring.  She has follow up antenatal testing later this week and is  scheduled for delivery in 1 week.  Thank you for allowing [place] to participate in your patient's care.  Please do not hesitate to contact us with any questions. ----------------------------------------------------------------------                   Consuelo Pandy, MD Electronically Signed Final Report   07/07/2019 11:43 am ----------------------------------------------------------------------    Assessment:  Tracey Logan is a 29 y.o. G3P0020 female at [redacted]w[redacted]d with mono-di twin pregnancy presenting for scheduled induction of labor.   Plan:  1. Admit to Labor & Delivery; consents reviewed and obtained  2. Fetal Well being  - Fetal Tracing: category I for both babies - GBS negative - Presentation: cephalic/cephalic confirmed by bedside ultrasound   3. Routine OB: - Prenatal labs reviewed, as above - Rh positive - CBC & T&S on admit - Clear fluids, IVF  4. Induction of Labor: -  Contractions monitored with external toco in place -  Plan for induction with misoprostol -  Plan for continuous fetal monitoring  -  Maternal pain control as desired: IVPM, regional anesthesia -  Anticipate vaginal delivery  Genia Del, CNM 07/14/2019 12:19 AM ----- Genia Del Certified Nurse Midwife [facility], Department of OB/GYN [facility]

## 2019-07-14 NOTE — Progress Notes (Signed)
Labor Progress Note  Tracey Logan is a 29 y.o. G4P0030 at [redacted]w[redacted]d by ultrasound admitted for induction of labor due to mono-di twins.  Subjective: comfortable now after epidural.   Objective: BP 124/75   Pulse 93   Temp 98.1 F (36.7 C) (Oral)   Resp 18   Ht 5\' 8"  (1.727 m)   Wt 99.3 kg   LMP 10/22/2018   SpO2 99%   BMI 33.30 kg/m  Notable VS details: reviewed  Fetal Assessment: Twin A: FHT:  FHR: 140 bpm, variability: moderate,  accelerations:  Present,  decelerations:  Absent Category/reactivity:  Category I  Twin B FHT:  FHR: 140 bpm, variability: moderate,  accelerations:  Present,  decelerations:  Absent Category/reactivity:  Category I UC:   irregular, every 1.5-4 minutes SVE:   5/80/-1, soft/anterior with BBOW. Bloody show noted.  Membrane status: intact   Labs: Lab Results  Component Value Date   WBC 7.2 07/14/2019   HGB 10.8 (L) 07/14/2019   HCT 31.0 (L) 07/14/2019   MCV 91.2 07/14/2019   PLT 185 07/14/2019    Assessment / Plan: G4 P0 at 37.2wks, IOL for mono-di twins  Labor: Progressing normally after 1 dose of cytotec. Dr 09/13/2019 aware of pt admission and progress.  Preeclampsia:  no e/o Pre-e Fetal Wellbeing:  Category I x 2 Pain Control:  Epidural I/D:  n/a Anticipated MOD:  NSVD- plan double setup in OR for delivery.   Feliberto Gottron, CNM 07/14/2019, 10:19 AM

## 2019-07-14 NOTE — Anesthesia Procedure Notes (Signed)
Epidural Patient location during procedure: OB Start time: 07/14/2019 7:48 AM End time: 07/14/2019 7:56 AM  Staffing Anesthesiologist: Candee Hoon, Cleda Mccreedy, MD Performed: anesthesiologist   Preanesthetic Checklist Completed: patient identified, IV checked, site marked, risks and benefits discussed, surgical consent, monitors and equipment checked, pre-op evaluation and timeout performed  Epidural Patient position: sitting Prep: ChloraPrep Patient monitoring: heart rate, continuous pulse ox and blood pressure Approach: midline Location: L4-L5 Injection technique: LOR saline  Needle:  Needle type: Tuohy  Needle gauge: 17 G Needle length: 9 cm and 9 Needle insertion depth: 9 cm Catheter type: closed end flexible Catheter size: 19 Gauge Catheter at skin depth: 15 cm Test dose: negative and 1.5% lidocaine with Epi 1:200 K  Assessment Sensory level: T10 Events: blood not aspirated, injection not painful, no injection resistance, no paresthesia and negative IV test  Additional Notes 2 attempts.  Transient right leg paresthesia that resolved with needle retraction and re direction. Pt. Evaluated and documentation done after procedure finished. Patient identified. Risks/Benefits/Options discussed with patient including but not limited to bleeding, infection, nerve damage, paralysis, failed block, incomplete pain control, headache, blood pressure changes, nausea, vomiting, reactions to medication both or allergic, itching and postpartum back pain. Confirmed with bedside nurse the patient's most recent platelet count. Confirmed with patient that they are not currently taking any anticoagulation, have any bleeding history or any family history of bleeding disorders. Patient expressed understanding and wished to proceed. All questions were answered. Sterile technique was used throughout the entire procedure. Please see nursing notes for vital signs. Test dose was given through epidural catheter  and negative prior to continuing to dose epidural or start infusion. Warning signs of high block given to the patient including shortness of breath, tingling/numbness in hands, complete motor block, or any concerning symptoms with instructions to call for help. Patient was given instructions on fall risk and not to get out of bed. All questions and concerns addressed with instructions to call with any issues or inadequate analgesia.   Patient tolerated the insertion well without immediate complications.Reason for block:procedure for pain

## 2019-07-14 NOTE — Anesthesia Preprocedure Evaluation (Signed)
Anesthesia Evaluation  Patient identified by MRN, date of birth, ID band Patient awake    Reviewed: Allergy & Precautions, H&P , NPO status , Patient's Chart, lab work & pertinent test results  Airway Mallampati: III  TM Distance: >3 FB Neck ROM: full    Dental  (+) Chipped   Pulmonary neg pulmonary ROS,    Pulmonary exam normal        Cardiovascular Exercise Tolerance: Good (-) hypertensionNormal cardiovascular exam+ dysrhythmias      Neuro/Psych    GI/Hepatic negative GI ROS,   Endo/Other    Renal/GU   negative genitourinary   Musculoskeletal   Abdominal   Peds  Hematology negative hematology ROS (+)   Anesthesia Other Findings History reviewed. No pertinent past medical history.  Past Surgical History: No date: WISDOM TOOTH EXTRACTION; Bilateral  BMI    Body Mass Index: 33.30 kg/m      Reproductive/Obstetrics (+) Pregnancy                             Anesthesia Physical Anesthesia Plan  ASA: III  Anesthesia Plan: Epidural   Post-op Pain Management:    Induction:   PONV Risk Score and Plan:   Airway Management Planned: Natural Airway  Additional Equipment:   Intra-op Plan:   Post-operative Plan:   Informed Consent: I have reviewed the patients History and Physical, chart, labs and discussed the procedure including the risks, benefits and alternatives for the proposed anesthesia with the patient or authorized representative who has indicated his/her understanding and acceptance.     Dental Advisory Given  Plan Discussed with: Anesthesiologist  Anesthesia Plan Comments: (Patient reports no bleeding problems and no anticoagulant use.   Patient consented for risks of anesthesia including but not limited to:  - adverse reactions to medications - risk of bleeding, infection, nerve damage and headache - risk of failed epidural - nerve damage due to  positioning - Damage to heart, brain, lungs or loss of life  Patient voiced understanding.)        Anesthesia Quick Evaluation

## 2019-07-14 NOTE — Progress Notes (Signed)
Labor Progress Note  Tracey Logan is a 29 y.o. G4P0030 at [redacted]w[redacted]d by ultrasound admitted for induction of labor due to mono-di twins.  Subjective: comfortable now after epidural.   Objective: BP 118/61   Pulse 85   Temp 98.2 F (36.8 C) (Oral)   Resp 18   Ht 5\' 8"  (1.727 m)   Wt 99.3 kg   LMP 10/22/2018   SpO2 99%   BMI 33.30 kg/m  Notable VS details: reviewed  Fetal Assessment: Twin A: FHT:  FHR: 140 bpm, variability: moderate,  accelerations:  Present,  decelerations:  Absent Category/reactivity:  Category I  Twin B FHT:  FHR: 140 bpm, variability: moderate,  accelerations:  Present,  decelerations:  Absent Category/reactivity:  Category I UC:   irregular, every 1.5-4 minutes SVE:   6/90/-1, soft/anterior with BBOW and Bloody show; AROM performed, mod amt clear fluid noted.  Membrane status: AROM at 1304   Labs: Lab Results  Component Value Date   WBC 7.2 07/14/2019   HGB 10.8 (L) 07/14/2019   HCT 31.0 (L) 07/14/2019   MCV 91.2 07/14/2019   PLT 185 07/14/2019    Assessment / Plan: G4 P0 at 37.2wks, IOL for mono-di twins  Labor: Progressing normally after 1 dose of cytotec. Dr 09/13/2019 aware of pt admission and progress. Now AROM performed, will start Pitocin if inadequate UC patter in Feliberto Gottron.  Preeclampsia:  no e/o Pre-e Fetal Wellbeing:  Category I x 2 Pain Control:  Epidural I/D:  n/a Anticipated MOD:  NSVD- plan double setup in OR for delivery.   , CNM 07/14/2019, 1:44 PM

## 2019-07-14 NOTE — Transfer of Care (Signed)
Immediate Anesthesia Transfer of Care Note  Patient: Tracey Logan  Procedure(s) Performed: VAGINAL DELIVERY IN OR WITH POSSIBLE CESAREAN (N/A )  Patient Location: PACU  Anesthesia Type:epidural  Level of Consciousness: awake, alert  and oriented  Airway & Oxygen Therapy: Patient Spontanous Breathing  Post-op Assessment: Report given to RN and Post -op Vital signs reviewed and stable  Post vital signs: Reviewed and stable  Last Vitals:  Vitals Value Taken Time  BP 134/83 07/14/19 1706  Temp    Pulse 103 07/14/19 1706  Resp 12 07/14/19 1706  SpO2 100 % 07/14/19 1706    Last Pain:  Vitals:   07/14/19 1706  TempSrc:   PainSc: 0-No pain         Complications: No apparent anesthesia complications

## 2019-07-15 LAB — CBC
HCT: 25.4 % — ABNORMAL LOW (ref 36.0–46.0)
Hemoglobin: 8.7 g/dL — ABNORMAL LOW (ref 12.0–15.0)
MCH: 31.5 pg (ref 26.0–34.0)
MCHC: 34.3 g/dL (ref 30.0–36.0)
MCV: 92 fL (ref 80.0–100.0)
Platelets: 186 10*3/uL (ref 150–400)
RBC: 2.76 MIL/uL — ABNORMAL LOW (ref 3.87–5.11)
RDW: 14.8 % (ref 11.5–15.5)
WBC: 12.8 10*3/uL — ABNORMAL HIGH (ref 4.0–10.5)
nRBC: 0 % (ref 0.0–0.2)

## 2019-07-15 LAB — ABO/RH: ABO/RH(D): O POS

## 2019-07-15 MED ORDER — LIDOCAINE HCL (PF) 2 % IJ SOLN
INTRAMUSCULAR | Status: AC
  Filled 2019-07-15: qty 5

## 2019-07-15 MED ORDER — FERROUS SULFATE 325 (65 FE) MG PO TABS
325.0000 mg | ORAL_TABLET | Freq: Two times a day (BID) | ORAL | Status: DC
  Administered 2019-07-15 – 2019-07-16 (×3): 325 mg via ORAL
  Filled 2019-07-15 (×3): qty 1

## 2019-07-15 NOTE — Anesthesia Postprocedure Evaluation (Signed)
Anesthesia Post Note  Patient: Tracey Logan  Procedure(s) Performed: VAGINAL DELIVERY IN OR WITH POSSIBLE CESAREAN (N/A )  Patient location during evaluation: Mother Baby Anesthesia Type: Epidural Level of consciousness: awake and alert Pain management: pain level controlled Vital Signs Assessment: post-procedure vital signs reviewed and stable Respiratory status: spontaneous breathing, nonlabored ventilation and respiratory function stable Cardiovascular status: stable Postop Assessment: no headache, no backache and epidural receding Anesthetic complications: no     Last Vitals:  Vitals:   07/15/19 0418 07/15/19 0815  BP: 116/80 117/84  Pulse: 93 82  Resp: 18 20  Temp: 36.9 C 36.9 C  SpO2: 100% 99%    Last Pain:  Vitals:   07/15/19 0845  TempSrc:   PainSc: 0-No pain                 Gerritt Galentine Lawerance Cruel

## 2019-07-15 NOTE — Lactation Note (Signed)
This note was copied from a baby's chart. Lactation Consultation Note  Patient Name: Tracey Logan Tracey Logan Date: 07/15/2019 Reason for consult: Follow-up assessment;Mother's request;Difficult latch;Early term 37-38.6wks;Multiple gestation  LC to assist with breastfeeding Twin B Tracey Logan). Mom reports that Tracey Logan has been feeding better than her sister, and will normally latch with little effort, however this feeding will be the second time baby has fed without the nipple shield.  RN had encouraged mom to attempt latching without the shield, that just because sister has to use the shield doesn't mean that Tracey Logan needs to as well.  Mom prefers a modified football hold where baby is almost sitting up facing the breast; however it appears that baby's neck is compromise and pushed forward. LC suggested an added pillow, bringing baby to breast level, and sandwiching of the tissue with slow release. Baby did pop on/off the breast for several minutes with some periods of consistent sucking. Baby appeared to cry out in pain, and was placed skin to skin on mom's chest where she relaxed and quieted quickly.  LC did set-up DEBP in room for mom. Encouraged to continue feeding efforts for both twins at the breast, followed up by use of DEBP for additional stimulation, milk removal, and encouragement of colostrum to transitional and mature milk.  Mom is prepared for feeding plan, and will start pumping post skin to skin with Tracey Logan and shower/lunch.  Maternal Data Formula Feeding for Exclusion: No Has patient been taught Hand Expression?: Yes Does the patient have breastfeeding experience prior to this delivery?: No  Feeding Feeding Type: Breast Fed  LATCH Score Latch: Repeated attempts needed to sustain latch, nipple held in mouth throughout feeding, stimulation needed to elicit sucking reflex.  Audible Swallowing: None  Type of Nipple: Everted at rest and after stimulation  Comfort  (Breast/Nipple): Soft / non-tender  Hold (Positioning): Assistance needed to correctly position infant at breast and maintain latch.  LATCH Score: 6  Interventions Interventions: DEBP  Lactation Tools Discussed/Used Pump Review: Setup, frequency, and cleaning;Milk Storage Initiated by:: Tracey Logan, MPH, IBCLC Date initiated:: 07/15/19   Consult Status Consult Status: Follow-up Date: 07/15/19 Follow-up type: In-patient    Danford Bad 07/15/2019, 12:50 PM

## 2019-07-15 NOTE — Progress Notes (Signed)
Post Partum 1  Subjective: up ad lib, voiding and tolerating PO  Doing well, no concerns. Ambulating without difficulty, pain managed with PO meds, tolerating regular diet, and voiding without difficulty. Reports perineal pain but well managed with PO meds.   No fever/chills, chest pain, shortness of breath, nausea/vomiting, or leg pain. No nipple or breast pain. No headache, visual changes, or RUQ/epigastric pain.  Objective: BP 117/84 (BP Location: Right Arm)   Pulse 82   Temp 98.4 F (36.9 C) (Oral)   Resp 20   Ht 5' 8"  (1.727 m)   Wt 99.3 kg   LMP 10/22/2018   SpO2 99%   Breastfeeding Unknown   BMI 33.30 kg/m    Physical Exam:  General: alert, cooperative and no distress Breasts: soft/nontender CV: RRR Pulm: nl effort, CTABL Abdomen: soft, non-tender, active bowel sounds Uterine Fundus: firm Perineum: minimal edema, lacerations hemostatic, repair well approximated Lochia: moderate DVT Evaluation: No evidence of DVT seen on physical exam.  Recent Labs    07/14/19 0042 07/15/19 0418  HGB 10.8* 8.7*  HCT 31.0* 25.4*  WBC 7.2 12.8*  PLT 185 186    Assessment/Plan: 29 y.o. [id number] postpartum day # 1  -Continue routine postpartum care -Lactation consult PRN for breastfeeding -Discussed contraceptive options including implant, IUDs hormonal and non-hormonal, injection, pills/ring/patch, condoms, and NFP.  -Acute blood loss anemia - hemodynamically stable and asymptomatic; start PO ferrous sulfate BID with stool softeners  -Immunization status: Needs  MMR prior to discharge  Disposition: Continue inpatient postpartum care    LOS: 1 day   Minda Meo, CNM 07/15/2019, 8:59 AM   ----- Drinda Butts  Certified Nurse Midwife [facility]

## 2019-07-15 NOTE — Lactation Note (Signed)
This note was copied from a baby's chart. Lactation Consultation Note  Patient Name: Tracey Logan [id number] Date: 07/15/2019    [facility] spoke to parents about feeding plan for twins [place] and [place]. Mom did successfully pump approximately 35mL of colostrum combined from both breasts. LC praised mom, and encouraged continued pumping every 2-3 hours.  Mom and dad have discussed a feeding pattern for the girls; opting to give any EBM/colostrum first followed by supplement PRN to aid in adequate intake for twins. LC provided understanding of parents decision, educated on impact that formula may have on milk supply, and babies learning to feed at the breast.   Mom again, feels confident in pumping and providing bottles/supplement for primary feeding at this time.       Danford Bad 07/15/2019, 4:21 PM

## 2019-07-15 NOTE — Lactation Note (Signed)
This note was copied from a baby's chart. Lactation Consultation Note  Patient Name: Tracey Logan [id number] Date: 07/15/2019 Reason for consult: Follow-up assessment;Mother's request;Early term 37-38.6wks;Multiple gestation;Difficult latch  LC given feeding report from RN; Twin A ([place]) appears to have a high palate and difficult latch, nipple shield (size 24) given to aid in feedings, but mom is continuing to get a long duration feed and baby be satisfied.   LC performed an oral test using gloved finger. Palate is high but small based on  Baby's size. Tongue appears to stay on the floor of the mouth, not touching the roof, and having difficulty with lateral movement. With open mouth it appears that baby may have a tongue restriction, making latching difficult.   Multiple attempts made to latch baby in both football and cross-cradle hold with chin support, tea cup hold of breast tissue without success of more than 3-4 sucks before baby would come off the breast. Colostrum was evident in the nipple shield, however duration at the breast was combined 20 minutes, but never consistent.  In speaking with the parents, mom commented they are prepared for supplementation needs, have formula mixing machine at home, and are ok with baby having supplement if needed to aid with calming, and mom also desires to begin pumping with goal of pumping and bottle feeding.  Parents chose Similac as supplement, LC educated on paced-bottle feeding, position of baby, formula preparation and discarding after 1 hour. Baby consumed 83mL of formula from bottle with little to no problems; no milk spillage, choking, or spit-up. Baby left skin to skin with Dad.  Mom desires to now pump and hand express colostrum/breast milk, give to baby prior to formula supplement, and will continue working on feedings at the breast.  Maternal Data Formula Feeding for Exclusion: No Has patient been taught Hand Expression?:  Yes Does the patient have breastfeeding experience prior to this delivery?: No  Feeding Feeding Type: Bottle Fed - Formula Nipple Type: Extra Slow Flow  LATCH Score Latch: Repeated attempts needed to sustain latch, nipple held in mouth throughout feeding, stimulation needed to elicit sucking reflex.  Audible Swallowing: None  Type of Nipple: Everted at rest and after stimulation  Comfort (Breast/Nipple): Soft / non-tender  Hold (Positioning): Assistance needed to correctly position infant at breast and maintain latch.  LATCH Score: 6  Interventions Interventions: Breast feeding basics reviewed;Breast massage;Skin to skin;Assisted with latch;Adjust position;Support pillows  Lactation Tools Discussed/Used Tools: Nipple Shields Nipple shield size: 24 Pump Review: Setup, frequency, and cleaning;Milk Storage Initiated by:: Magnus Ivan, MPH, IBCLC Date initiated:: 07/15/19   Consult Status Consult Status: Follow-up Date: 07/15/19 Follow-up type: In-patient    Danford Bad 07/15/2019, 12:43 PM

## 2019-07-16 LAB — CBC
HCT: 23.6 % — ABNORMAL LOW (ref 36.0–46.0)
Hemoglobin: 8 g/dL — ABNORMAL LOW (ref 12.0–15.0)
MCH: 32 pg (ref 26.0–34.0)
MCHC: 33.9 g/dL (ref 30.0–36.0)
MCV: 94.4 fL (ref 80.0–100.0)
Platelets: 186 10*3/uL (ref 150–400)
RBC: 2.5 MIL/uL — ABNORMAL LOW (ref 3.87–5.11)
RDW: 15.3 % (ref 11.5–15.5)
WBC: 8 10*3/uL (ref 4.0–10.5)
nRBC: 0 % (ref 0.0–0.2)

## 2019-07-16 MED ORDER — ACETAMINOPHEN 500 MG PO TABS: 1000.0000 mg | ORAL_TABLET | Freq: Four times a day (QID) | ORAL | 0 refills | Status: AC | PRN

## 2019-07-16 MED ORDER — IBUPROFEN 600 MG PO TABS: 600.0000 mg | ORAL_TABLET | Freq: Four times a day (QID) | ORAL | 0 refills | Status: AC | PRN

## 2019-07-16 NOTE — Progress Notes (Signed)
Discharge order received from doctor. Rubella vaccine offered at discharge. Patient refused Rubella vaccine. Reviewed discharge instructions and prescriptions with patient and answered all questions. Follow up appointment instructions given. Patient verbalized understanding. ID bands checked. Patient discharged home with infant via wheelchair by nursing/auxillary.    Oswald Hillock, RN

## 2019-07-16 NOTE — Lactation Note (Signed)
This note was copied from a baby's chart. Lactation Consultation Note  Patient Name: Tracey Logan [id number] Date: 07/16/2019 Reason for consult: Follow-up assessment;1st time breastfeeding;Multiple gestation;Early term 37-38.6wks  LC spoke to mom and dad before discharge.   Parents continued to bottle feed with twins overnight, feeling more confident and better rested. Mom is pumping every 2-3 hours, and plans to continue once home with her own personal DEBP- [employer].   LC discussed importance of stimulation, and consistency with pumping routine, use of warmth before/while pumping, and benefits of skin to skin with the twins to aid in bringing in milk supply. Reviewed normal course of lactation, timing of transitional and mature milk.  Parents plan to use baby Tracey Logan formula prep machine, discussed formula mixing/preparation instructions, formula storage, paced bottle feeding.   Reviewed breast milk storage, warming, and cleaning of all breast pump supplies, bottles, and tools thoroughly.   Encouraged mom to reach out to lactation department post discharge with any questions and/or concerns, contact information given. Mom and dad had no further questions at this time and are looking forward to going home.  Maternal Data Has patient been taught Hand Expression?: Yes Does the patient have breastfeeding experience prior to this delivery?: No  Feeding Feeding Type: Bottle Fed - Formula  LATCH Score                   Interventions Interventions: Breast feeding basics reviewed;DEBP  Lactation Tools Discussed/Used Pump Review: Setup, frequency, and cleaning;Milk Storage Initiated by:: Magnus Ivan, MPH, IBCLC Date initiated:: 07/15/19   Consult Status Consult Status: Complete Date: 07/16/19 Follow-up type: Call as needed    Danford Bad 07/16/2019, 9:35 AM

## 2019-07-16 NOTE — Discharge Instructions (Signed)
Please call your doctor or return to the ER if you experience any chest pains, shortness of breath, dizziness, visual changes, severe headache (unrelieved by pain meds), fever greater than 101, any heavy bleeding (saturating more than 1 pad per hour), large clots, or foul smelling discharge, any worsening abdominal pain and cramping that is not controlled by pain medication, any calf/leg pain or redness, any breast concerns (redness/pain), or any signs of postpartum depression. No tampons, enemas, douches, or sexual intercourse for 6 weeks. Also avoid tub baths, hot tubs, or swimming for 6 weeks.        After Your Delivery Discharge Instructions   Postpartum: Care Instructions  After childbirth (postpartum period), your body goes through many changes. Some of these changes happen over several weeks. In the hours after delivery, your body will begin to recover from childbirth while it prepares to breastfeed your newborn. You may feel emotional during this time. Your hormones can shift your mood without warning for no clear reason.  In the first couple of weeks after childbirth, many women have emotions that change from happy to sad. You may find it hard to sleep. You may cry a lot. This is called the "baby blues." These overwhelming emotions often go away within a couple of days or weeks. But it's important to discuss your feelings with your doctor.  You should call your care provider if you have unrelieved feelings of:  Inability to cope  Sadness  Anxiety  Lack of interest in baby  Insomnia  Crying  It is easy to get too tired and overwhelmed during the first weeks after childbirth. Don't try to do too much. Get rest whenever you can, accept help from others, and eat well and drink plenty of fluids.  About 4 to 6 weeks after your baby's birth, you will have a follow-up visit with your care provider. This visit is your time to talk to your provider about anything you are concerned or  curious about.  Follow-up care is a key part of your treatment and safety. Be sure to make and go to all appointments, and call your doctor if you are having problems. It's also a good idea to know your test results and keep a list of the medicines you take.  How can you care for yourself at home?  Sleep or rest when your baby sleeps.  Get help with household chores from family or friends, if you can. Do not try to do it all yourself.  If you have hemorrhoids or swelling or pain around the opening of your vagina, try using cold and heat. You can put ice or a cold pack on the area for 10 to 20 minutes at a time. Put a thin cloth between the ice and your skin. Also try sitting in a few inches of warm water (sitz bath) 3 times a day and after bowel movements.  Take pain medicines exactly as directed.  If the provider gave you a prescription medicine for pain, take it as prescribed.  If you do not have a prescription and need something over the counter, you can take:  Ibuprofen (Motrin, Advil) up to 600mg every 6 hours as needed for pain  Acetaminophen (Tylenol) up to 650mg every 4 hours as needed for pain  Some people find it helpful to alternate between these two medications.   No driving for 1-2 weeks or while taking pain medications.   Eat more fiber to avoid constipation. Include foods such as whole-grain breads   and cereals, raw vegetables, raw and dried fruits, and beans.  Drink plenty of fluids, enough so that your urine is light yellow or clear like water. If you have kidney, heart, or liver disease and have to limit fluids, talk with your doctor before you increase the amount of fluids you drink.  Do not put anything in the vagina for 6 weeks. This means no sex, no tampons, no douching, and no enemas.  If you have stitches, keep the area clean by pouring or spraying warm water over the area outside your vagina and anus after you use the toilet.  No strenuous activity or heavy  lifting for 6 weeks   No tub baths; showers only  Continue prenatal vitamin and iron.  If breastfeeding:  Increase calories and fluids while breastfeeding.  You may have a slight fever when your milk comes in, but it should go away on its own. If it does not, and rises above 101.0 please call the doctor.  For breastfeeding concerns, the lactation consultant can be reached at [phone].  For concerns about your baby, please call your pediatrician.   Keep a list of questions to bring to your postpartum visit. Your questions might be about:  Changes in your breasts, such as lumps or soreness.  When to expect your menstrual period to start again.  What form of birth control is best for you.  Weight you have put on during the pregnancy.  Exercise options.  What foods and drinks are best for you, especially if you are breastfeeding.  Problems you might be having with breastfeeding.  When you can have sex. Some women may want to talk about lubricants for the vagina.  Any feelings of sadness or restlessness that you are having.   When should you call for help?  Call 911 anytime you think you may need emergency care. For example, call if:  You have thoughts of harming yourself, your baby, or another person.  You passed out (lost consciousness).  Call the office at [phone] or seek immediate medical care if:  If you have heavy bleeding such that you are soaking 1 pad in an hour for 2 hours  You are dizzy or lightheaded, or you feel like you may faint.  You have a fever; a temperature of 101.0 F or greater  Chills  Difficulty urinating  Headache unrelieved by "pain meds"   Visual changes  Pain in the right side of your belly near your ribs  Breasts reddened, hard, hot to the touch or any other breast concerns  Nipple discharge which is foul-smelling or contains pus   New pain unrelieved with recommended over-the-counter dosages  Difficulty breathing  with or without chest pain   New leg pain, swelling, or redness, especially if it is only on one leg  Any other concerns  Watch closely for changes in your health, and be sure to contact your provider if:  You have new or worse vaginal discharge.  You feel sad or depressed.  You are having problems with your breasts or breastfeeding.    

## 2019-07-17 LAB — SURGICAL PATHOLOGY

## 2020-08-10 IMAGING — US US MFM OB FOLLOW-UP
1 series · 13 of 25 positions shown · non-contrast
Comparison: none

PATIENT INFO:

PERFORMED BY:
                                                            YAABKA AK
SERVICE(S) PROVIDED:
 ----------------------------------------------------------------------
INDICATIONS:
  31 weeks gestation of pregnancy
FETAL EVALUATION (FETUS A):
 Num Of Fetuses:         2
 Fetal Heart Rate(bpm):  141
 Cardiac Activity:       Present
 Fetal Lie:              Maternal Left
 Presentation:           Vertex
 Placenta:               Posterior
 Membrane Desc:      Monochorionic - diamniotic
 AFI Sum(cm)     %Tile       Largest Pocket(cm)
 3.04            < 3
 RUQ(cm)
GESTATIONAL AGE (FETUS A):
 LMP:           31w 6d        Date:  10/22/18                 EDD:   07/29/19
 Best:          31w 2d     Det. By:  Early Ultrasound         EDD:   08/02/19
ANATOMY (FETUS A):
 Stomach:               Seen                   Bladder:                Seen
FETAL EVALUATION (FETUS B):
 Fetal Heart Rate(bpm):  145
 Fetal Lie:              Maternal Right
 2.92            < 3
GESTATIONAL AGE (FETUS B):
ANATOMY (FETUS B):

[Series 1: us mfm ob follow-up · 0.25mm/px · 13 of 25 slices shown]
[im 1/25]
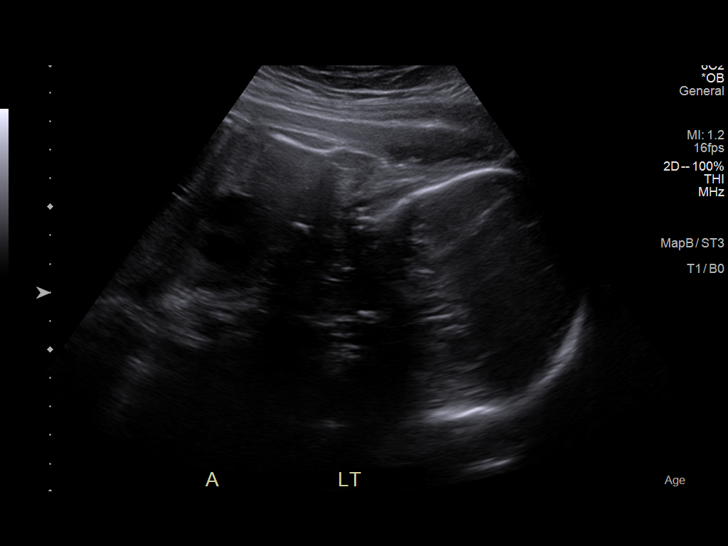
[im 3/25]
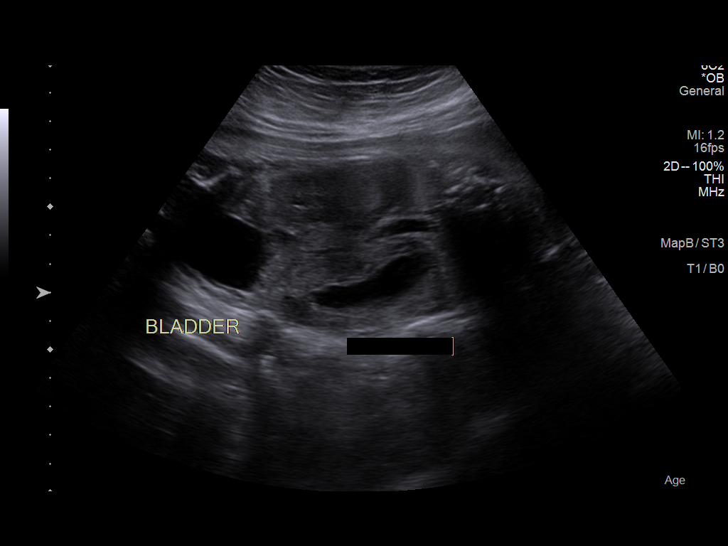
[im 5/25]
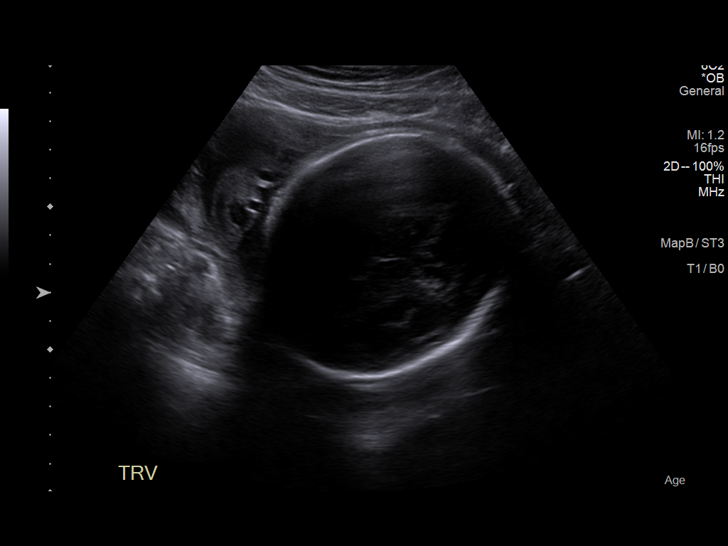
[im 7/25]
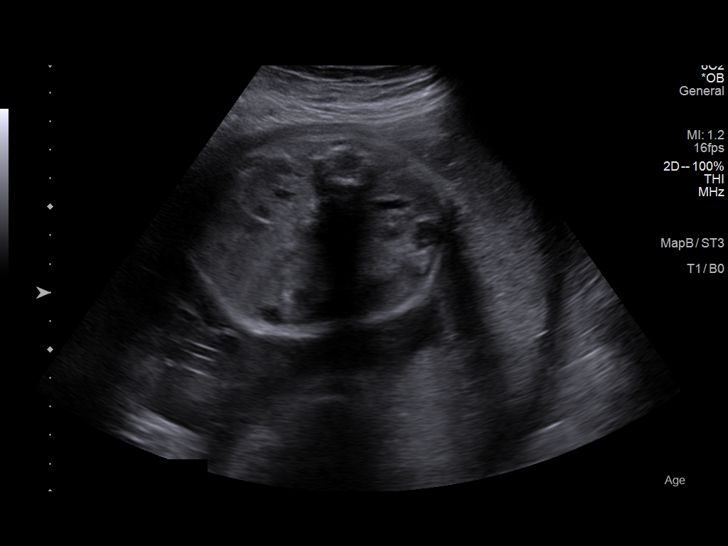
[im 9/25]
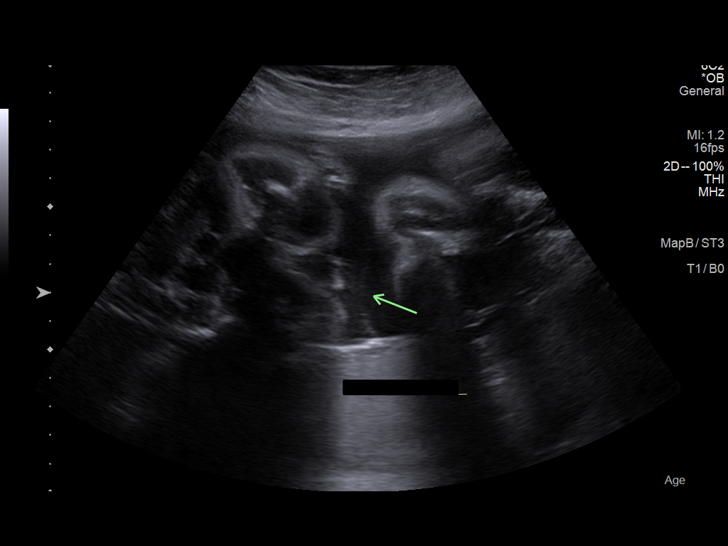
[im 11/25]
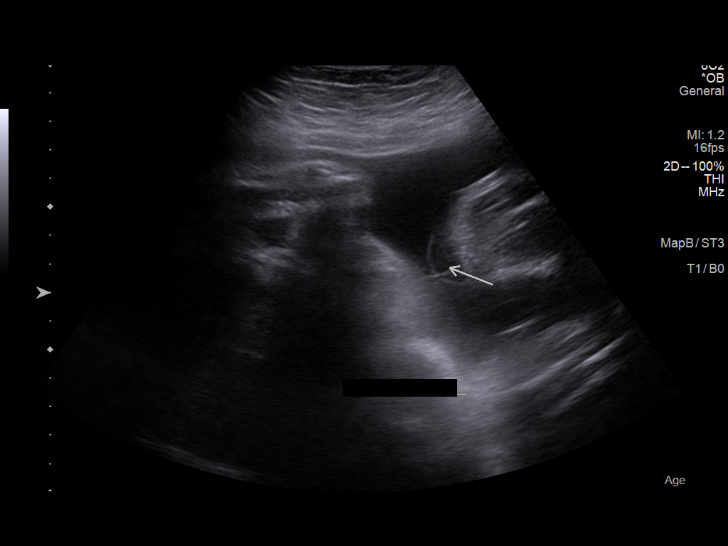
[im 13/25]
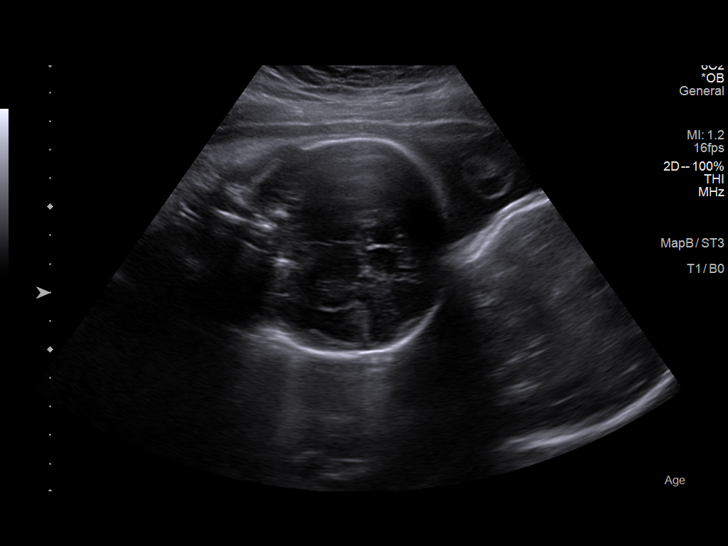
[im 15/25]
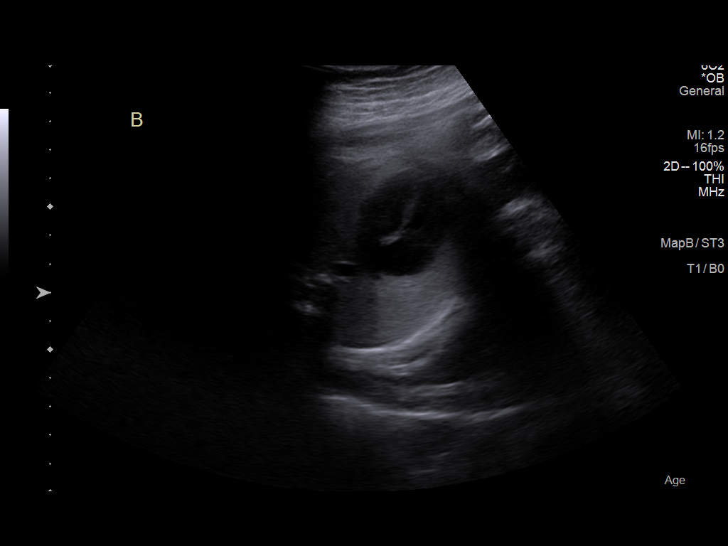
[im 17/25]
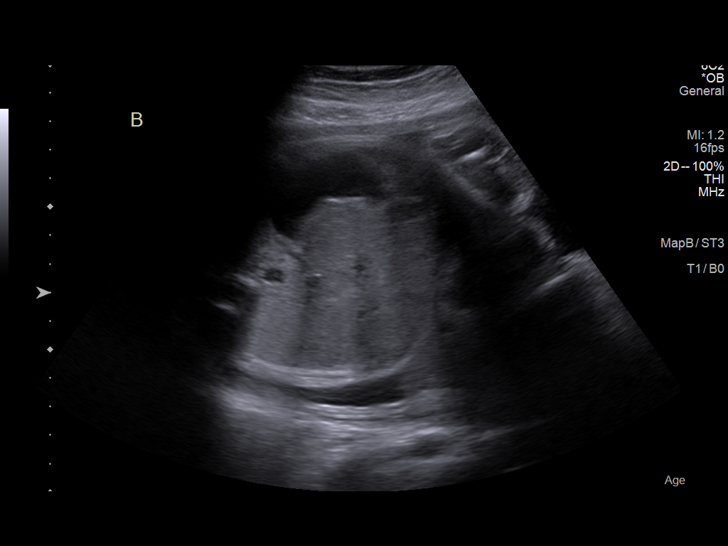
[im 19/25]
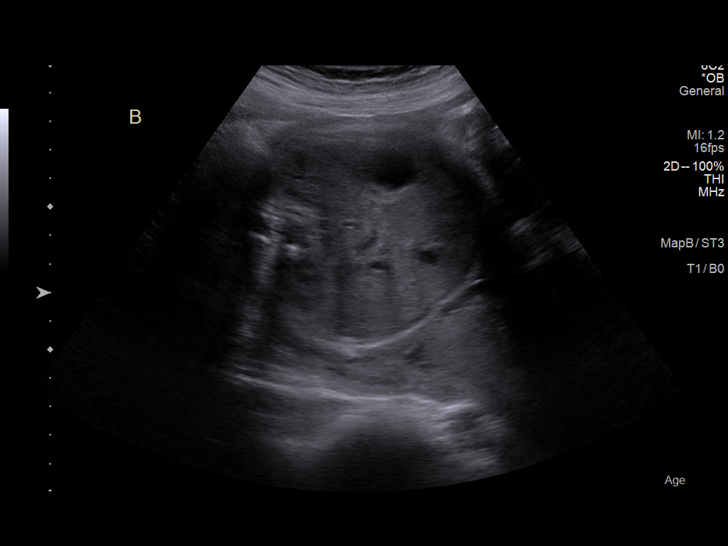
[im 21/25]
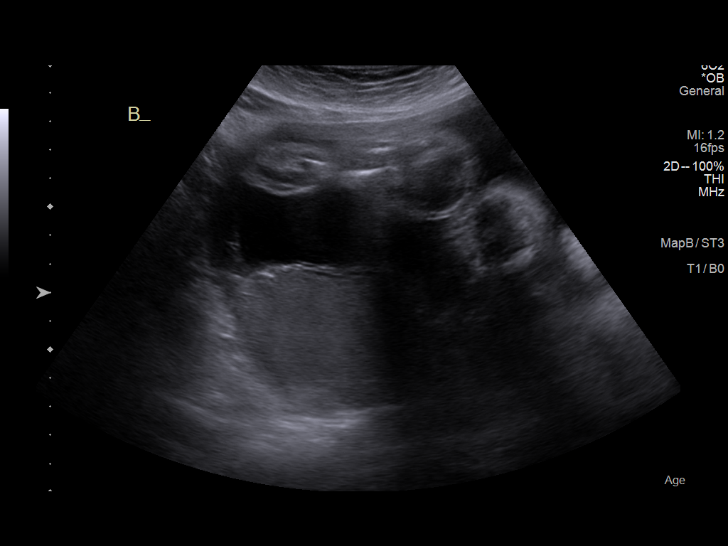
[im 23/25]
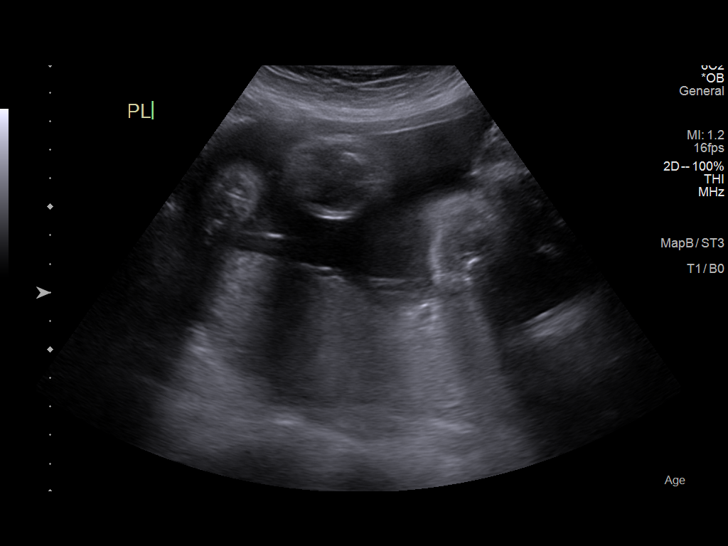
[im 25/25]
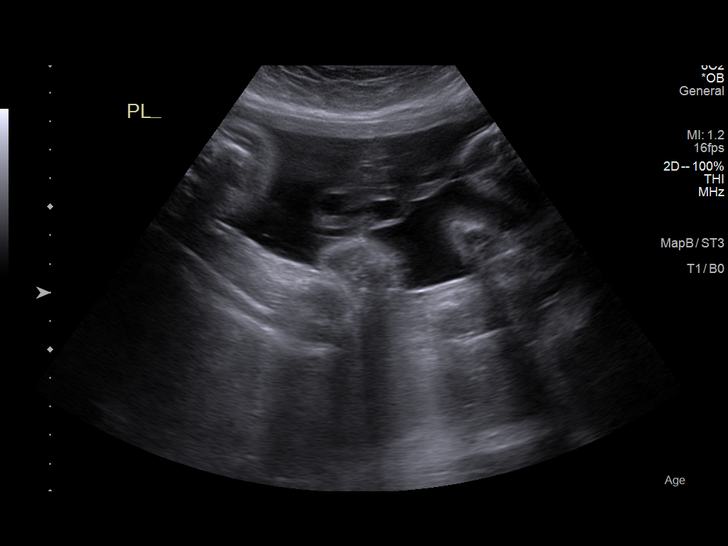

[13 of 25 positions shown; findings below may reference images not displayed]

IMPRESSION: Ampofo Gadafi,

 Thank you for referring your patient for a twin to Twin
 transfusion check due to Balefi S Abubakar twins .

 Simmi Brum/Nghuumbwa Brisley pregnancy is identified at 31 w 2d based on
 earliest ultrasound at [REDACTED] on 12/19/18 -
 measurements on that exam were 7w 5d

 A thin  dividing membrane is once again identified.

 Twin A is located on the maternal    left   side (posterior
 placenta).
 Twin B is located on the maternal   right    side (posterior
 placenta).

 Twin A is cephalic
 Twin B is cephalic

 The amniotic fluid is within normal limits in each sac.
 The MVP for Twin A is  3.0  cm.
 The MVP for Twin B is  2.9  cm.

 No sign of TTTS .

 Follow-up BPPs are  suggested in 1 week. Growth in 2 weeks
 .
 These evaluations have been scheduled at [facility]]
 perinatal ( if more convenient at primary [facility] we can
 cancel)
 Consideration of delivery at 37 weeks

 Thank you for allowing us to participate in your patient's care.

## 2020-09-07 IMAGING — US US MFM OB FOLLOW-UP
1 series · 12 of 28 positions shown · non-contrast
Comparison: none

PATIENT INFO:

PERFORMED BY:
                   Sonographer                              YNIC
SERVICE(S) PROVIDED:
 ----------------------------------------------------------------------
INDICATIONS:
  35 weeks gestation of pregnancy
FETAL EVALUATION (FETUS A):
 Num Of Fetuses:         2
 Fetal Heart Rate(bpm):  141
 Cardiac Activity:       Present
 Fetal Lie:              Maternal Left
 Presentation:           Vertex
 Placenta:               Fundal Grade  2 and 3, No previa
 Amniotic Fluid
 AFI FV:      Within normal limits
                             Largest Pocket(cm)
BIOPHYSICAL EVALUATION (FETUS A):
 Amniotic F.V:   Within normal limits       F. Tone:        Observed
 F. Movement:    Observed                   Score:          [DATE]
 F. Breathing:   Observed
BIOMETRY (FETUS A):
 BPD:      86.5  mm     G. Age:  34w 6d         42  %    CI:        76.04   %    70 - 86
                                                         FL/HC:      21.8   %    20.1 -
 HC:      314.4  mm     G. Age:  35w 2d         19  %    HC/AC:      1.04        0.93 -
 AC:       303   mm     G. Age:  34w 2d         28  %    FL/BPD:     79.2   %    71 - 87
 FL:       68.5  mm     G. Age:  35w 1d         41  %    FL/AC:      22.6   %    20 - 24
 HUM:      61.2  mm     G. Age:  35w 3d         68  %
 Est. FW:    3924  gm      5 lb 8 oz     35  %     FW Discordancy      0 \ 4 %
GESTATIONAL AGE (FETUS A):
 LMP:           35w 6d        Date:  10/22/18                 EDD:   07/29/19
 U/S Today:     34w 6d                                        EDD:   08/05/19
 Best:          35w 2d     Det. By:  Early Ultrasound         EDD:   08/02/19
DOPPLER - FETAL VESSELS (FETUS A):
 Umbilical Artery
  S/D     %tile
  2.9       72
FETAL EVALUATION (FETUS B):
 Fetal Lie:              Maternal Right
 Placenta:               Fundal Grade 2 and 3, No previa
BIOPHYSICAL EVALUATION (FETUS B):
BIOMETRY (FETUS B):
 BPD:      83.4  mm     G. Age:  33w 4d         11  %    CI:        76.41   %    70 - 86
                                                         FL/HC:      22.6   %    20.1 -
 HC:      302.3  mm     G. Age:  33w 4d        < 3  %    HC/AC:      1.00        0.93 -
 AC:      302.2  mm     G. Age:  34w 2d         26  %    FL/BPD:     82.0   %    71 - 87
 FL:       68.4  mm     G. Age:  35w 1d         40  %    FL/AC:      22.6   %    20 - 24
 HUM:      60.5  mm     G. Age:  35w 1d         60  %
 Est. FW:    8449  gm      5 lb 5 oz     27  %     FW Discordancy         4  %
GESTATIONAL AGE (FETUS B):
 U/S Today:     34w 1d                                        EDD:   08/10/19
DOPPLER - FETAL VESSELS (FETUS B):

[Series 1: us mfm ob follow-up · 89 acquisitions, 12 frames shown]
[im 4/89]
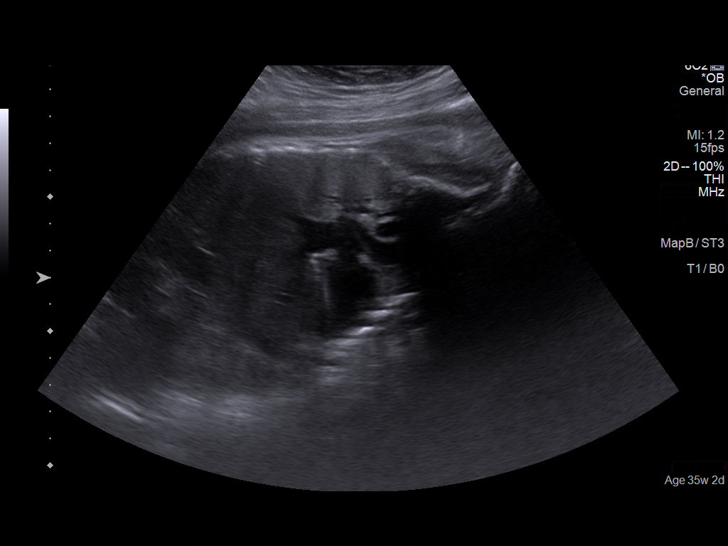
[im 10/89]
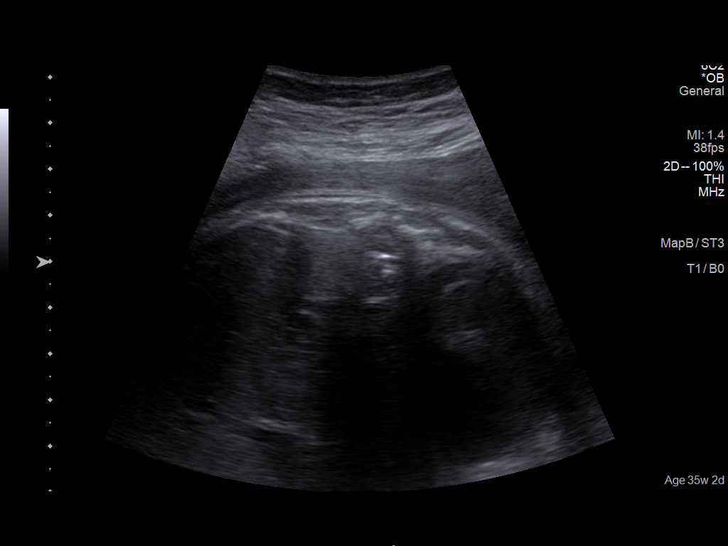
[im 17/89]
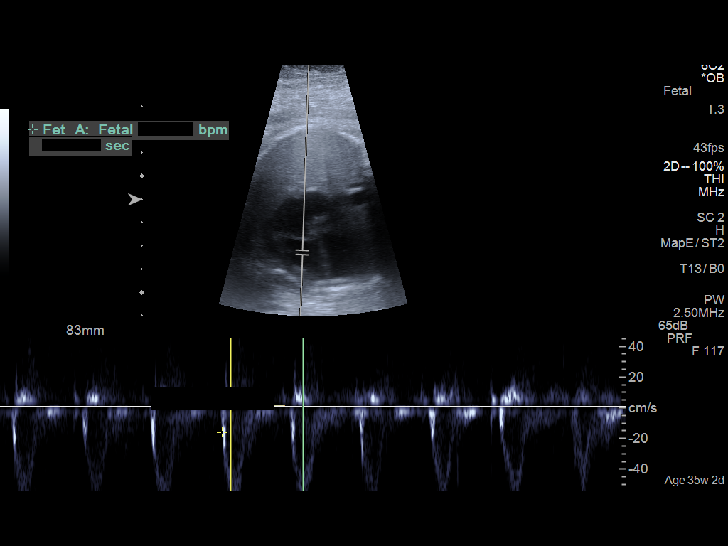
[im 27/89]
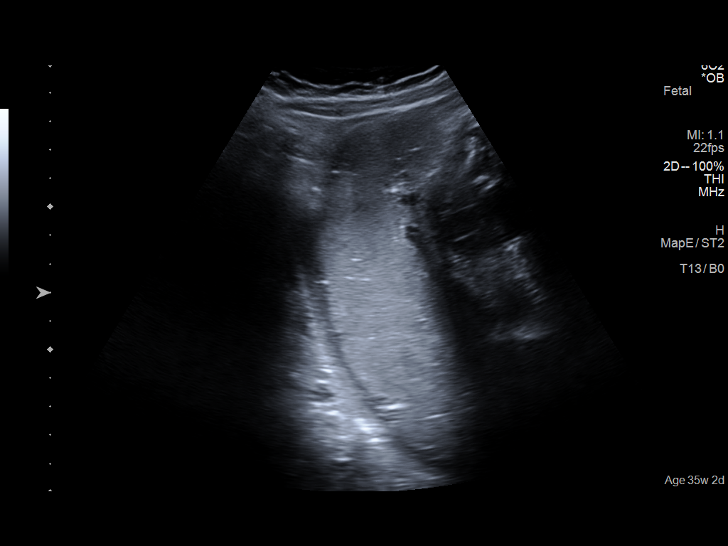
[im 33/89]
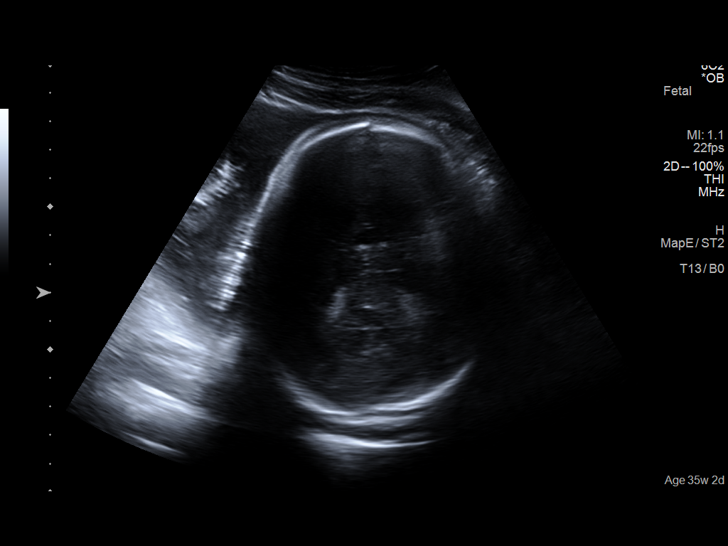
[im 40/89]
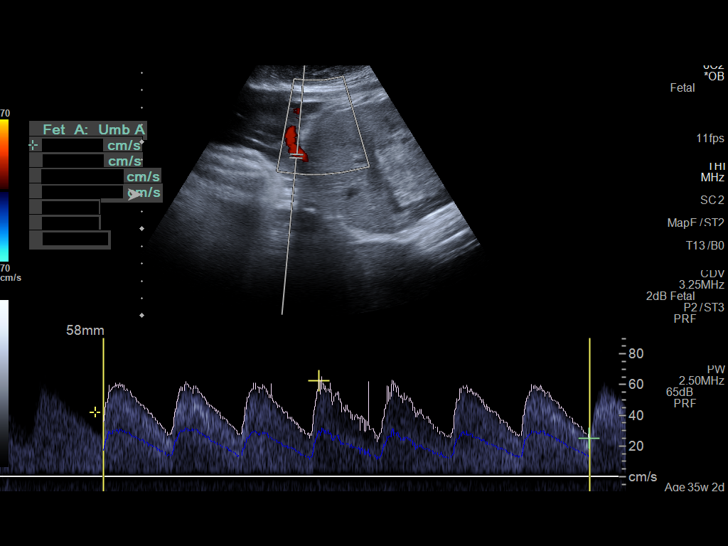
[im 49/89]
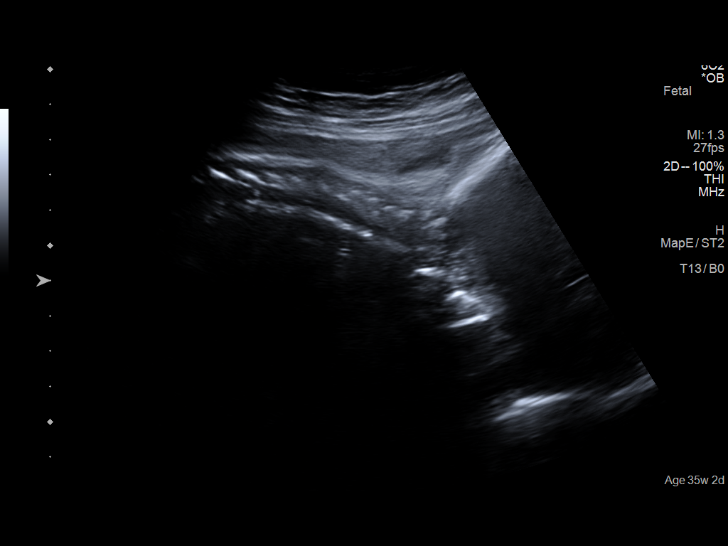
[im 56/89]
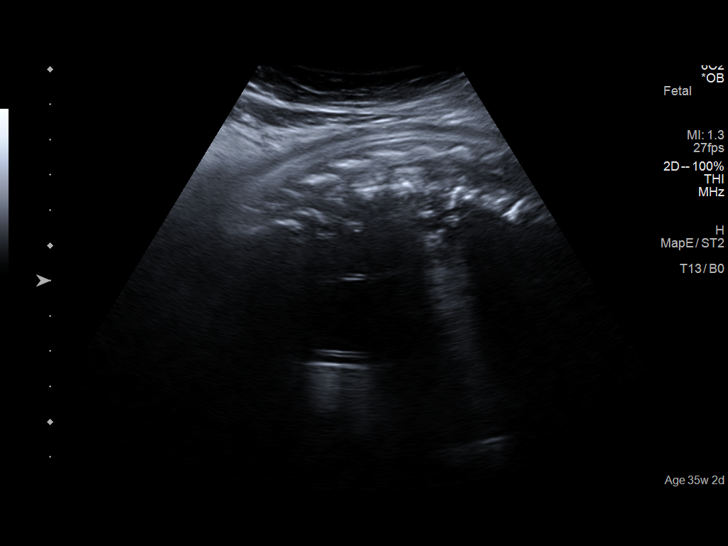
[im 62/89]
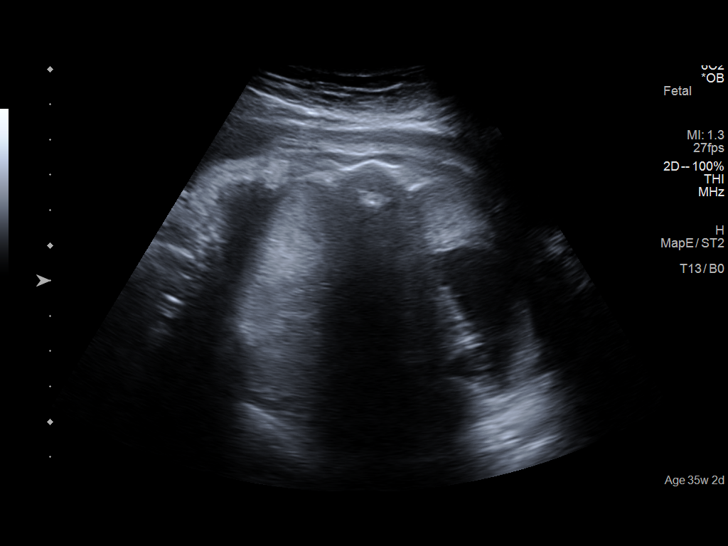
[im 72/89]
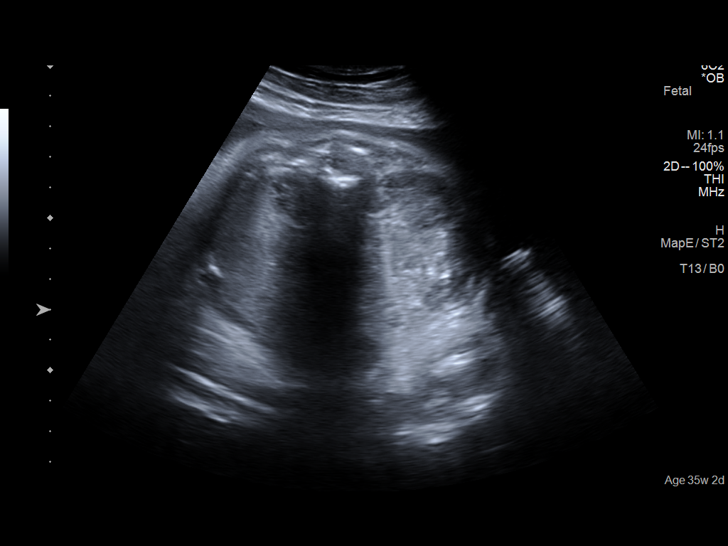
[im 79/89]
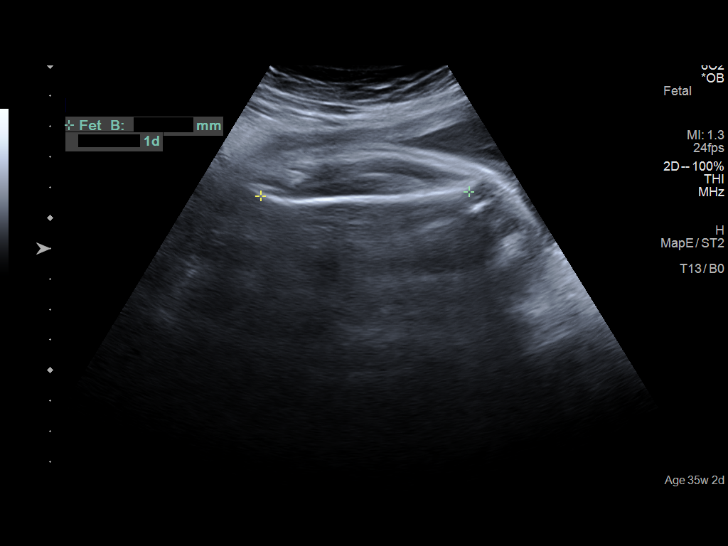
[im 85/89]
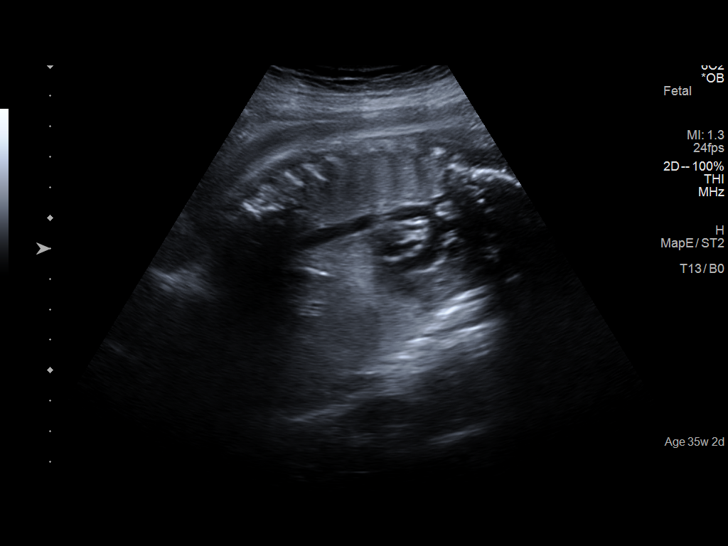

[12 of 28 positions shown; findings below may reference images not displayed]

IMPRESSION: Dear Ms YNIC
 ,

 Thank you for referring your patient  for ultrasound for twin
 growth evaluation.

 Edmar De Sallas/Rofiah Gong pregnancy is again identified at 35w 2d by
 earliest scan performed at [REDACTED] on 12/19/2018 at
 7w 5d.

 A thin dividing membrane is once again identified.

 Twin A is located on the maternal    left   side (single posterior
 placenta).
 Twin A is cephalic.  Limited follow up anatomy appears
 normal.
 EFW is at the 35%ile
 The MVP for Twin A is 3.4   cm.  BPP [DATE]

 Twin B is located on the maternal    right   side (single
 posterior placenta).
   Twin B is cephalic.  Limited follow up anatomy appears
 normal.
 EFW 27th %ile
 The MVP for Twin B is  5.2 cm.   BPP [DATE]

 Both twins are appropriately grown today .
 The fetal weight discordance measured within normal limits.

 The amniotic fluid is within normal limits in each sac.
 I recommend twice weekly monitoring.
 Dopplers are no longer recommended given normal interval
 growth.
 Patient has a scheduled induction.
 She complained of contractions today - NST performed
 REactive x 2 occasional irritability no UCs.
 precautions given for going to L&D

 Thank you for allowing us to participate in your patient's care.

## 2022-01-12 ENCOUNTER — Ambulatory Visit: Payer: Managed Care, Other (non HMO) | Attending: Sports Medicine | Admitting: Physical Therapy

## 2022-01-12 ENCOUNTER — Encounter: Payer: Self-pay | Admitting: Physical Therapy

## 2022-01-12 DIAGNOSIS — M5459 Other low back pain: Secondary | ICD-10-CM | POA: Diagnosis not present

## 2022-01-12 DIAGNOSIS — R29898 Other symptoms and signs involving the musculoskeletal system: Secondary | ICD-10-CM | POA: Diagnosis present

## 2022-01-12 DIAGNOSIS — R293 Abnormal posture: Secondary | ICD-10-CM | POA: Insufficient documentation

## 2022-01-12 DIAGNOSIS — M6281 Muscle weakness (generalized): Secondary | ICD-10-CM | POA: Diagnosis not present

## 2022-01-12 NOTE — Therapy (Signed)
OUTPATIENT PHYSICAL THERAPY FEMALE PELVIC EVALUATION   Patient Name: Tracey Logan MRN: [id number] DOB:02/15/1991, 31 y.o., female Today's Date: 01/12/2022   PT End of Session - 01/12/22 1513     Visit Number 1    Number of Visits 12    Date for PT Re-Evaluation 04/06/22    Authorization Type IE 01/12/2022    PT Start Time 1520    PT Stop Time 1600    PT Time Calculation (min) 40 min    Activity Tolerance Patient tolerated treatment well    Behavior During Therapy [facility] for tasks assessed/performed             History reviewed. No pertinent past medical history. Past Surgical History:  Procedure Laterality Date   WISDOM TOOTH EXTRACTION Bilateral    Patient Active Problem List   Diagnosis Date Noted   Encounter for planned induction of labor 07/14/2019   Twin pregnancy, twins dichorionic and diamniotic 07/11/2019   Twin pregnancy, monochorionic/diamniotic, third trimester 06/25/2019   Maternal care for restricted fetal growth, antepartum 06/16/2019   Monochorionic diamniotic twin gestation 12/30/2018   History of spontaneous abortion x2 12/30/2018    PCP: [facility]  REFERRING PROVIDER: Jerrel Ivory, DO  REFERRING DIAG: Abdominal weakness   THERAPY DIAG:  Muscle weakness (generalized)  Other low back pain  Abnormal posture  Rationale for Evaluation and Treatment Rehabilitation  PRECAUTIONS: None  WEIGHT BEARING RESTRICTIONS No  FALLS:  Has patient fallen in last 6 months? No  ONSET DATE: 2021  SUBJECTIVE:                                                                                                                                                                                           CHIEF COMPLAINT: Patient states that since delivering twins she has had issues. Patient competes in cowboy mounted shooting. Patient notes that with sporting activity she has some urinary leakage, but the main concern is postural instability.  Patient notes that she does utilize some calisthenics/BW training.    PERTINENT HISTORY/CHART REVIEW:  Red flags (bowel/bladder changes, saddle paresthesia, personal history of cancer, h/o spinal tumors, h/o compression fx, h/o abdominal aneurysm, abdominal pain, chills/fever, night sweats, nausea, vomiting, unrelenting pain, first onset of insidious LBP <20 y/o): Negative   PAIN:  Are you having pain? No   OCCUPATION/LEISURE ACTIVITIES:  Horseback riding   PLOF:  Independent  PATIENT GOALS: "Be able to connect with my core; to engage it. Stability."  OBSTETRICAL HISTORY: G4P2 Deliveries: SVD Tearing/Episiotomy:  Birthing position:  GYNECOLOGICAL HISTORY: Hysterectomy: No Vaginal/Abdominal Endometriosis: Negative Last Menstrual Period:  Pain with exam: No  Prolapse: None Heaviness/pressure: No   UROLOGICAL HISTORY: Frequency of urination: every 1 hours Incontinence: Sneezing and horseback riding  Onset: 2021 Amount: Min/Mod Protective undergarments: Yes   Type: pantyliner; Number used/day: 1x/day Fluid Intake: ~120 oz H20, 16-20 oz coffee caffeinated Nocturia: 0x/night Incomplete emptying: Yes  Pain with urination: Negative Stream: Strong Urgency: No  Difficulty initiating urination: Negative Intermittent stream: Negative Frequent UTI: Negative.   GASTROINTESTINAL HISTORY: Type of bowel movement: (Bristol Stool Scale) 4 Frequency of BMs: 1x/day Incomplete bowel movement: No  Pain with defecation: Negative Straining with defecation: Negative  SEXUAL HISTORY AND FUNCTION: Sexually active: Yes  Pain with penetration: none Pain with external stimulation: No  Change in ability to achieve orgasm: No  Sexual abuse: No    OBJECTIVE:  DIAGNOSTIC TESTING/IMAGING: none  COGNITION:  Patient is oriented to person, place, and time.  Recent memory is intact.  Remote memory is intact.  Attention span and concentration are intact.  Expressive speech is  intact.  Patient's fund of knowledge is within normal limits for educational level.    POSTURE/OBSERVATIONS:   Lumbar lordosis: WNL Thoracic kyphosis: WNL Patient assumes some mild anterior pelvic tilt in standing.  Supine to sit transfer revealed rectus abdominis dominant strategy with some bulging, no coning/doming.    GAIT: Grossly WFL.    RANGE OF MOTION:   AROM (Normal range in degrees) AROM  01/12/2022  Lumbar   Flexion (65) WNL  Extension (30) WNL  Right lateral flexion (25) WNL  Left lateral flexion (25) WNL  Right rotation (30) WNL  Left rotation (30) WNL      Hip LEFT RIGHT  Flexion (125) WNL WNL  Extension (15)    Abduction (40) WNL WNL  Adduction     Internal Rotation (45) ~15 ^ ~ 15 ^  External Rotation (45) WNL WNL  (* = pain; blank rows = not tested; ^ visual estimate)   SENSATION:  Grossly intact to light touch bilateral LEs as determined by testing dermatomes L2-S2 Proprioception and hot/cold testing deferred on this date   STRENGTH: MMT    RLE LLE  Hip Flexion    Hip Extension    Hip Abduction     Hip Adduction     Hip ER  5 4  Hip IR  5 5  Knee Extension 5 5  Knee Flexion 5 5  Dorsiflexion  5 5  (* = pain; blank rows = deferred to next visit)   MUSCLE LENGTH:  Hamstrings: R: Negative L: Negative Adductors: R: Negative L: Negative  ABDOMINAL:   Palpation: no TTP Diastasis: none noted on testing Rib flare: none noted on testing   PHYSICAL PERFORMANCE MEASURES:  STS: WNL Deep Squat: RLE STS: LLE STS:  : 5TSTS:     EXTERNAL PELVIC EXAM: deferred 2/2 to time constraints None given; testing deferred to later date  Breath coordination: Voluntary Contraction: present/absent Relaxation: full/delayed/non-relaxing Perineal movement with sustained IAP increase ("bear down"): descent/no change/elevation/excessive descent Perineal movement with rapid IAP increase ("cough"): elevation/no change/descent Palpation of  bulbocavernosus: Palpation of ischiocavernosus: Palpation of pubic symphysis: Palpation of superficial transverse perineal:   PATIENT EDUCATION:  Patient educated on prognosis, POC, and provided with HEP including: [id number]. Patient articulated understanding and returned demonstration. Patient will benefit from further education in order to maximize compliance and understanding for long-term therapeutic gains.   PATIENT SURVEYS:  FOTO Urinary 67  ASSESSMENT:  Clinical Impression: Patient is a 31 year old presenting to clinic with chief complaints of decreased  postural control during sporting activity. Today's evaluation is suggestive of deficits in PFM strength/coordination, TrA strength and control, and load management through the spine. Patient's responses on FOTO outcome measures (Urinary 67) indicate moderate functional limitations/disability/distress. Patient's progress may be limited due to the high-intensity demands of sporting activity; however, patient's motivation and current level of fitness are advantageous. Patient was able to achieve TrA activation in quadruped with improved proprioception during today's evaluation. Patient will benefit from continued skilled therapeutic intervention to address deficits in PFM strength/coordination, TrA strength and control, and load management through the spine in order to improve sport performance, increase function, and improve overall QOL.   Objective impairments: decreased activity tolerance, decreased balance, decreased coordination, decreased endurance, decreased strength, improper body mechanics, and postural dysfunction.   Activity limitations:  sporting activity .   Personal factors: Fitness, Past/current experiences, and Time since onset of injury/illness/exacerbation are also affecting patient's functional outcome.   Rehab Potential: Excellent  Clinical decision making: Stable/uncomplicated  Evaluation complexity:  Low   GOALS: Goals reviewed with patient? Yes  SHORT TERM GOALS: Target date: 02/23/2022  Patient will demonstrate independence with HEP in order to maximize therapeutic gains and improve carryover from physical therapy sessions to ADLs in the home and community. Baseline: [id number] Goal status: INITIAL  LONG TERM GOALS: Target date: 04/06/2022  Patient will demonstrate improved function as evidenced by a score of 74 on FOTO measure for full participation in activities at home and in the community.  Baseline: 67 Goal status: INITIAL  Patient will be able to perform 60-100 repetitions of TrA contraction in the presence of extremity challenge and resistance for improved postural endurance during sporting activity.  Baseline: 4 reps in quadruped Goal status: INITIAL  Patient will be able to articulate and demonstrate 3-5 postural strengthening interventions for improved independent management of postural control for sporting activity.    Baseline: not formally assessed  Goal status: INITIAL   PLAN: Rehab frequency: 1x/week  Rehab duration: 12 weeks  Planned interventions: Therapeutic exercises, Therapeutic activity, Neuromuscular re-education, Balance training, Gait training, Patient/Family education, Self Care, Joint mobilization, Orthotic/Fit training, Electrical stimulation, Spinal mobilization, Cryotherapy, Moist heat, Manual lymph drainage, scar mobilization, Taping, and Manual therapy     Sheria Lang PT, DPT [phone]  01/12/2022, 3:17 PM

## 2022-01-19 ENCOUNTER — Encounter: Payer: Self-pay | Admitting: Physical Therapy

## 2022-01-19 ENCOUNTER — Ambulatory Visit: Payer: Managed Care, Other (non HMO) | Admitting: Physical Therapy

## 2022-01-19 DIAGNOSIS — M6281 Muscle weakness (generalized): Secondary | ICD-10-CM | POA: Diagnosis not present

## 2022-01-19 DIAGNOSIS — R293 Abnormal posture: Secondary | ICD-10-CM

## 2022-01-19 DIAGNOSIS — R29898 Other symptoms and signs involving the musculoskeletal system: Secondary | ICD-10-CM

## 2022-01-19 NOTE — Therapy (Signed)
OUTPATIENT PHYSICAL THERAPY FEMALE PELVIC TREATMENT   Patient Name: Tracey Logan MRN: [id number] DOB:December 28, 1990, 31 y.o., female Today's Date: 01/19/2022   PT End of Session - 01/19/22 1342     Visit Number 2    Number of Visits 12    Date for PT Re-Evaluation 04/06/22    Authorization Type IE 01/12/2022    PT Start Time 1345    PT Stop Time 1425    PT Time Calculation (min) 40 min    Activity Tolerance Patient tolerated treatment well    Behavior During Therapy [facility] for tasks assessed/performed             History reviewed. No pertinent past medical history. Past Surgical History:  Procedure Laterality Date   WISDOM TOOTH EXTRACTION Bilateral    Patient Active Problem List   Diagnosis Date Noted   Encounter for planned induction of labor 07/14/2019   Twin pregnancy, twins dichorionic and diamniotic 07/11/2019   Twin pregnancy, monochorionic/diamniotic, third trimester 06/25/2019   Maternal care for restricted fetal growth, antepartum 06/16/2019   Monochorionic diamniotic twin gestation 12/30/2018   History of spontaneous abortion x2 12/30/2018    PCP: [place]  REFERRING PROVIDER: Diamond Nickel, DO  REFERRING DIAG: Abdominal weakness   THERAPY DIAG:  Muscle weakness (generalized)  Abnormal posture  Poor body mechanics  Rationale for Evaluation and Treatment Rehabilitation  PRECAUTIONS: None  WEIGHT BEARING RESTRICTIONS No  FALLS:  Has patient fallen in last 6 months? No  ONSET DATE: 2021  SUBJECTIVE:                                                                                                                                                                                           CHIEF COMPLAINT: Patient states that since delivering twins she has had issues. Patient competes in cowboy mounted shooting. Patient notes that with sporting activity she has some urinary leakage, but the main concern is postural instability.  Patient notes that she does utilize some calisthenics/BW training.    PERTINENT HISTORY/CHART REVIEW:  Red flags (bowel/bladder changes, saddle paresthesia, personal history of cancer, h/o spinal tumors, h/o compression fx, h/o abdominal aneurysm, abdominal pain, chills/fever, night sweats, nausea, vomiting, unrelenting pain, first onset of insidious LBP <20 y/o): Negative   PAIN:  Are you having pain? No   OCCUPATION/LEISURE ACTIVITIES:  Horseback riding   PLOF:  Independent  PATIENT GOALS: "Be able to connect with my core; to engage it. Stability."  OBSTETRICAL HISTORY: G4P2 Deliveries: SVD Tearing/Episiotomy:  Birthing position:  GYNECOLOGICAL HISTORY: Hysterectomy: No Vaginal/Abdominal Endometriosis: Negative Last Menstrual Period:  Pain with exam: No  Prolapse: None Heaviness/pressure: No   UROLOGICAL HISTORY: Frequency of urination: every 1 hours Incontinence: Sneezing and horseback riding  Onset: 2021 Amount: Min/Mod Protective undergarments: Yes   Type: pantyliner; Number used/day: 1x/day Fluid Intake: ~120 oz H20, 16-20 oz coffee caffeinated Nocturia: 0x/night Incomplete emptying: Yes  Pain with urination: Negative Stream: Strong Urgency: No  Difficulty initiating urination: Negative Intermittent stream: Negative Frequent UTI: Negative.   GASTROINTESTINAL HISTORY: Type of bowel movement: (Bristol Stool Scale) 4 Frequency of BMs: 1x/day Incomplete bowel movement: No  Pain with defecation: Negative Straining with defecation: Negative  SEXUAL HISTORY AND FUNCTION: Sexually active: Yes  Pain with penetration: none Pain with external stimulation: No  Change in ability to achieve orgasm: No  Sexual abuse: No    OBJECTIVE:  DIAGNOSTIC TESTING/IMAGING: none  COGNITION:  Patient is oriented to person, place, and time.  Recent memory is intact.  Remote memory is intact.  Attention span and concentration are intact.  Expressive speech is  intact.  Patient's fund of knowledge is within normal limits for educational level.    POSTURE/OBSERVATIONS:   Lumbar lordosis: WNL Thoracic kyphosis: WNL Patient assumes some mild anterior pelvic tilt in standing.  Supine to sit transfer revealed rectus abdominis dominant strategy with some bulging, no coning/doming.    GAIT: Grossly WFL.    RANGE OF MOTION:   AROM (Normal range in degrees) AROM  01/12/2022  Lumbar   Flexion (65) WNL  Extension (30) WNL  Right lateral flexion (25) WNL  Left lateral flexion (25) WNL  Right rotation (30) WNL  Left rotation (30) WNL      Hip LEFT RIGHT  Flexion (125) WNL WNL  Extension (15)    Abduction (40) WNL WNL  Adduction     Internal Rotation (45) ~15 ^ ~ 15 ^  External Rotation (45) WNL WNL  (* = pain; blank rows = not tested; ^ visual estimate)   SENSATION:  Grossly intact to light touch bilateral LEs as determined by testing dermatomes L2-S2 Proprioception and hot/cold testing deferred on this date   STRENGTH: MMT    RLE LLE  Hip Flexion    Hip Extension    Hip Abduction     Hip Adduction     Hip ER  5 4  Hip IR  5 5  Knee Extension 5 5  Knee Flexion 5 5  Dorsiflexion  5 5  (* = pain; blank rows = deferred to next visit)   MUSCLE LENGTH:  Hamstrings: R: Negative L: Negative Adductors: R: Negative L: Negative  ABDOMINAL:   Palpation: no TTP Diastasis: none noted on testing Rib flare: none noted on testing   PHYSICAL PERFORMANCE MEASURES:  STS: WNL Deep Squat: RLE STS: LLE STS:  6MWT: 5TSTS:      TREATMENT- 01/19/2022 SUBJECTIVE: Patient notes she has been doing exercises and can tell when she is activating the muscle.  PAIN: 0/10  Pre-treatment assessment:  RLE LLE  Hip Flexion 5 5  Hip Extension 4 4  Hip Abduction  3+ 3+  Hip Adduction  3 3   Manual Therapy:   Neuromuscular Re-education: Postural control interventions as follows: Quadruped TrA Tall kneeling anti-extension hip  hinge with TrA, GTB Half kneeling, Pallof press, GTB, B Half kneeling, Pallof press, (lead LE on balance disc), GTB, B  Therapeutic Exercise:   Treatments unbilled:  Post-treatment assessment:  Patient educated throughout session on appropriate technique and form using multi-modal cueing, HEP, and activity modification. Patient articulated understanding and returned demonstration.  Patient Response to interventions: Notes appropriate muscle fatigue  ASSESSMENT    PATIENT SURVEYS:  FOTO Urinary 67  ASSESSMENT:  Clinical Impression: Patient presents to clinic with excellent motivation to participate in therapy. Patient demonstrates deficits in PFM strength/coordination, TrA strength and control, and load management through the spine. Patient able to achieve good TrA activation in kneeling postures with minimal to moderate cueing during today's session and responded positively to active interventions. Patient will benefit from continued skilled therapeutic intervention to address remaining deficits in PFM strength/coordination, TrA strength and control, and load management through the spine in order to increase function and improve overall QOL.    Objective impairments: decreased activity tolerance, decreased balance, decreased coordination, decreased endurance, decreased strength, improper body mechanics, and postural dysfunction.   Activity limitations:  sporting activity .   Personal factors: Fitness, Past/current experiences, and Time since onset of injury/illness/exacerbation are also affecting patient's functional outcome.   Rehab Potential: Excellent  Clinical decision making: Stable/uncomplicated  Evaluation complexity: Low   GOALS: Goals reviewed with patient? Yes  SHORT TERM GOALS: Target date: 02/23/2022  Patient will demonstrate independence with HEP in order to maximize therapeutic gains and improve carryover from physical therapy sessions to ADLs in the home  and community. Baseline: [id number] Goal status: INITIAL  LONG TERM GOALS: Target date: 04/06/2022  Patient will demonstrate improved function as evidenced by a score of 74 on FOTO measure for full participation in activities at home and in the community.  Baseline: 67 Goal status: INITIAL  Patient will be able to perform 60-100 repetitions of TrA contraction in the presence of extremity challenge and resistance for improved postural endurance during sporting activity.  Baseline: 4 reps in quadruped Goal status: INITIAL  Patient will be able to articulate and demonstrate 3-5 postural strengthening interventions for improved independent management of postural control for sporting activity.    Baseline: not formally assessed  Goal status: INITIAL   PLAN: Rehab frequency: 1x/week  Rehab duration: 12 weeks  Planned interventions: Therapeutic exercises, Therapeutic activity, Neuromuscular re-education, Balance training, Gait training, Patient/Family education, Self Care, Joint mobilization, Orthotic/Fit training, Electrical stimulation, Spinal mobilization, Cryotherapy, Moist heat, Manual lymph drainage, scar mobilization, Taping, and Manual therapy     Sheria Lang PT, DPT [phone]  01/19/2022, 1:45 PM

## 2022-02-02 ENCOUNTER — Encounter: Payer: Self-pay | Admitting: Physical Therapy

## 2022-02-02 ENCOUNTER — Ambulatory Visit: Payer: Managed Care, Other (non HMO) | Admitting: Physical Therapy

## 2022-02-02 DIAGNOSIS — M6281 Muscle weakness (generalized): Secondary | ICD-10-CM | POA: Diagnosis not present

## 2022-02-02 DIAGNOSIS — R29898 Other symptoms and signs involving the musculoskeletal system: Secondary | ICD-10-CM

## 2022-02-02 DIAGNOSIS — R293 Abnormal posture: Secondary | ICD-10-CM

## 2022-02-02 NOTE — Therapy (Signed)
OUTPATIENT PHYSICAL THERAPY FEMALE PELVIC TREATMENT   Patient Name: Tracey Logan MRN: [id number] DOB:May 07, 1990, 31 y.o., female Today's Date: 02/02/2022   PT End of Session - 02/02/22 1519     Visit Number 3    Number of Visits 12    Date for PT Re-Evaluation 04/06/22    Authorization Type IE 01/12/2022    PT Start Time 1518    PT Stop Time 1600    PT Time Calculation (min) 42 min    Activity Tolerance Patient tolerated treatment well    Behavior During Therapy [facility] for tasks assessed/performed             History reviewed. No pertinent past medical history. Past Surgical History:  Procedure Laterality Date   WISDOM TOOTH EXTRACTION Bilateral    Patient Active Problem List   Diagnosis Date Noted   Encounter for planned induction of labor 07/14/2019   Twin pregnancy, twins dichorionic and diamniotic 07/11/2019   Twin pregnancy, monochorionic/diamniotic, third trimester 06/25/2019   Maternal care for restricted fetal growth, antepartum 06/16/2019   Monochorionic diamniotic twin gestation 12/30/2018   History of spontaneous abortion x2 12/30/2018    PCP: [facility]  REFERRING PROVIDER: Jerrel Ivory, DO  REFERRING DIAG: Abdominal weakness   THERAPY DIAG:  Muscle weakness (generalized)  Abnormal posture  Poor body mechanics  Rationale for Evaluation and Treatment Rehabilitation  PRECAUTIONS: None  WEIGHT BEARING RESTRICTIONS No  FALLS:  Has patient fallen in last 6 months? No  ONSET DATE: 2021  SUBJECTIVE:                                                                                                                                                                                           CHIEF COMPLAINT: Patient states that since delivering twins she has had issues. Patient competes in cowboy mounted shooting. Patient notes that with sporting activity she has some urinary leakage, but the main concern is postural instability.  Patient notes that she does utilize some calisthenics/BW training.    PERTINENT HISTORY/CHART REVIEW:  Red flags (bowel/bladder changes, saddle paresthesia, personal history of cancer, h/o spinal tumors, h/o compression fx, h/o abdominal aneurysm, abdominal pain, chills/fever, night sweats, nausea, vomiting, unrelenting pain, first onset of insidious LBP <19 y/o): Negative   PAIN:  Are you having pain? No   OCCUPATION/LEISURE ACTIVITIES:  Horseback riding   PLOF:  Independent  PATIENT GOALS: "Be able to connect with my core; to engage it. Stability."  OBSTETRICAL HISTORY: G4P2 Deliveries: SVD Tearing/Episiotomy:  Birthing position:  GYNECOLOGICAL HISTORY: Hysterectomy: No Vaginal/Abdominal Endometriosis: Negative Last Menstrual Period:  Pain with exam: No  Prolapse: None Heaviness/pressure: No   UROLOGICAL HISTORY: Frequency of urination: every 1 hours Incontinence: Sneezing and horseback riding  Onset: 2021 Amount: Min/Mod Protective undergarments: Yes   Type: pantyliner; Number used/day: 1x/day Fluid Intake: ~120 oz H20, 16-20 oz coffee caffeinated Nocturia: 0x/night Incomplete emptying: Yes  Pain with urination: Negative Stream: Strong Urgency: No  Difficulty initiating urination: Negative Intermittent stream: Negative Frequent UTI: Negative.   GASTROINTESTINAL HISTORY: Type of bowel movement: (Bristol Stool Scale) 4 Frequency of BMs: 1x/day Incomplete bowel movement: No  Pain with defecation: Negative Straining with defecation: Negative  SEXUAL HISTORY AND FUNCTION: Sexually active: Yes  Pain with penetration: none Pain with external stimulation: No  Change in ability to achieve orgasm: No  Sexual abuse: No    OBJECTIVE:  DIAGNOSTIC TESTING/IMAGING: none  COGNITION:  Patient is oriented to person, place, and time.  Recent memory is intact.  Remote memory is intact.  Attention span and concentration are intact.  Expressive speech is  intact.  Patient's fund of knowledge is within normal limits for educational level.    POSTURE/OBSERVATIONS:   Lumbar lordosis: WNL Thoracic kyphosis: WNL Patient assumes some mild anterior pelvic tilt in standing.  Supine to sit transfer revealed rectus abdominis dominant strategy with some bulging, no coning/doming.    GAIT: Grossly WFL.    RANGE OF MOTION:   AROM (Normal range in degrees) AROM  01/12/2022  Lumbar   Flexion (65) WNL  Extension (30) WNL  Right lateral flexion (25) WNL  Left lateral flexion (25) WNL  Right rotation (30) WNL  Left rotation (30) WNL      Hip LEFT RIGHT  Flexion (125) WNL WNL  Extension (15)    Abduction (40) WNL WNL  Adduction     Internal Rotation (45) ~15 ^ ~ 15 ^  External Rotation (45) WNL WNL  (* = pain; blank rows = not tested; ^ visual estimate)   SENSATION:  Grossly intact to light touch bilateral LEs as determined by testing dermatomes L2-S2 Proprioception and hot/cold testing deferred on this date   STRENGTH: MMT    RLE LLE  Hip Flexion    Hip Extension    Hip Abduction     Hip Adduction     Hip ER  5 4  Hip IR  5 5  Knee Extension 5 5  Knee Flexion 5 5  Dorsiflexion  5 5  (* = pain; blank rows = deferred to next visit)   MUSCLE LENGTH:  Hamstrings: R: Negative L: Negative Adductors: R: Negative L: Negative  ABDOMINAL:   Palpation: no TTP Diastasis: none noted on testing Rib flare: none noted on testing   PHYSICAL PERFORMANCE MEASURES:  STS: WNL Deep Squat: RLE STS: LLE STS:  : 5TSTS:      TREATMENT- 02/02/2022 SUBJECTIVE: Patient reports that she has noticed a difference in posture and comfort when riding and walking.   PAIN: 0/10  Pre-treatment assessment:  RLE LLE  Hip Flexion 5 5  Hip Extension 4 4  Hip Abduction  3+ 3+  Hip Adduction  3 3   Manual Therapy:   Neuromuscular Re-education: Postural control interventions as follows:  Total gym pilates tall kneeling facing  straps (row, chest expansion, thigh stretch)  Total gym pilates tall kneeling facing away (row, serve a tray)  Total gym pilates tall kneeling, lateral/rotational (fencing, punching)  BOSU rebounder, 6-8#, fwd, R lateral, L lateral  Therapeutic Exercise:   Treatments unbilled:  Post-treatment assessment:  Patient educated throughout session on  appropriate technique and form using multi-modal cueing, HEP, and activity modification. Patient articulated understanding and returned demonstration.  Patient Response to interventions: Notes appropriate muscle fatigue  ASSESSMENT    PATIENT SURVEYS:  FOTO Urinary 67  ASSESSMENT:  Clinical Impression: Patient presents to clinic with excellent motivation to participate in therapy. Patient demonstrates deficits in PFM strength/coordination, TrA strength and control, and load management through the spine. Patient with improved postural endurance and alignment with interventions on unstable/moving surface during today's session and responded positively to active interventions. Patient will benefit from continued skilled therapeutic intervention to address remaining deficits in PFM strength/coordination, TrA strength and control, and load management through the spine in order to increase function and improve overall QOL.    Objective impairments: decreased activity tolerance, decreased balance, decreased coordination, decreased endurance, decreased strength, improper body mechanics, and postural dysfunction.   Activity limitations:  sporting activity .   Personal factors: Fitness, Past/current experiences, and Time since onset of injury/illness/exacerbation are also affecting patient's functional outcome.   Rehab Potential: Excellent  Clinical decision making: Stable/uncomplicated  Evaluation complexity: Low   GOALS: Goals reviewed with patient? Yes  SHORT TERM GOALS: Target date: 02/23/2022  Patient will demonstrate independence  with HEP in order to maximize therapeutic gains and improve carryover from physical therapy sessions to ADLs in the home and community. Baseline: [id number] Goal status: INITIAL  LONG TERM GOALS: Target date: 04/06/2022  Patient will demonstrate improved function as evidenced by a score of 74 on FOTO measure for full participation in activities at home and in the community.  Baseline: 67 Goal status: INITIAL  Patient will be able to perform 60-100 repetitions of TrA contraction in the presence of extremity challenge and resistance for improved postural endurance during sporting activity.  Baseline: 4 reps in quadruped Goal status: INITIAL  Patient will be able to articulate and demonstrate 3-5 postural strengthening interventions for improved independent management of postural control for sporting activity.    Baseline: not formally assessed  Goal status: INITIAL   PLAN: Rehab frequency: 1x/week  Rehab duration: 12 weeks  Planned interventions: Therapeutic exercises, Therapeutic activity, Neuromuscular re-education, Balance training, Gait training, Patient/Family education, Self Care, Joint mobilization, Orthotic/Fit training, Electrical stimulation, Spinal mobilization, Cryotherapy, Moist heat, Manual lymph drainage, scar mobilization, Taping, and Manual therapy     Sheria Lang PT, DPT [phone]  02/02/2022, 3:20 PM

## 2022-02-09 ENCOUNTER — Ambulatory Visit: Payer: Managed Care, Other (non HMO) | Attending: Sports Medicine | Admitting: Physical Therapy

## 2022-02-09 DIAGNOSIS — R293 Abnormal posture: Secondary | ICD-10-CM | POA: Diagnosis present

## 2022-02-09 DIAGNOSIS — M6281 Muscle weakness (generalized): Secondary | ICD-10-CM | POA: Insufficient documentation

## 2022-02-09 DIAGNOSIS — R29898 Other symptoms and signs involving the musculoskeletal system: Secondary | ICD-10-CM | POA: Insufficient documentation

## 2022-02-09 NOTE — Therapy (Signed)
OUTPATIENT PHYSICAL THERAPY FEMALE PELVIC TREATMENT   Patient Name: Tracey Logan MRN: [id number] DOB:[birth year], 31 y.o., female Today's Date: 02/09/2022   PT End of Session - 02/09/22 1609     Visit Number 4    Number of Visits 12    Date for PT Re-Evaluation 04/06/22    Authorization Type IE 01/12/2022    PT Start Time 1515    PT Stop Time 1600    PT Time Calculation (min) 45 min    Activity Tolerance Patient tolerated treatment well    Behavior During Therapy [facility] for tasks assessed/performed             No past medical history on file. Past Surgical History:  Procedure Laterality Date   WISDOM TOOTH EXTRACTION Bilateral    Patient Active Problem List   Diagnosis Date Noted   Encounter for planned induction of labor 07/14/2019   Twin pregnancy, twins dichorionic and diamniotic 07/11/2019   Twin pregnancy, monochorionic/diamniotic, third trimester 06/25/2019   Maternal care for restricted fetal growth, antepartum 06/16/2019   Monochorionic diamniotic twin gestation 12/30/2018   History of spontaneous abortion x2 12/30/2018    PCP: [place]  REFERRING PROVIDER: Diamond Nickel, DO  REFERRING DIAG: Abdominal weakness   THERAPY DIAG:  Muscle weakness (generalized)  Abnormal posture  Poor body mechanics  Rationale for Evaluation and Treatment Rehabilitation  PRECAUTIONS: None  WEIGHT BEARING RESTRICTIONS No  FALLS:  Has patient fallen in last 6 months? No  ONSET DATE: 2021  SUBJECTIVE:                                                                                                                                                                                           CHIEF COMPLAINT: Patient states that since delivering twins she has had issues. Patient competes in cowboy mounted shooting. Patient notes that with sporting activity she has some urinary leakage, but the main concern is postural instability. Patient notes that she does  utilize some calisthenics/BW training.    PERTINENT HISTORY/CHART REVIEW:  Red flags (bowel/bladder changes, saddle paresthesia, personal history of cancer, h/o spinal tumors, h/o compression fx, h/o abdominal aneurysm, abdominal pain, chills/fever, night sweats, nausea, vomiting, unrelenting pain, first onset of insidious LBP <20 y/o): Negative   PAIN:  Are you having pain? No   OCCUPATION/LEISURE ACTIVITIES:  Horseback riding   PLOF:  Independent  PATIENT GOALS: "Be able to connect with my core; to engage it. Stability."  OBSTETRICAL HISTORY: G4P2 Deliveries: SVD Tearing/Episiotomy:  Birthing position:  GYNECOLOGICAL HISTORY: Hysterectomy: No Vaginal/Abdominal Endometriosis: Negative Last Menstrual Period:  Pain with exam: No  Prolapse:  None Heaviness/pressure: No   UROLOGICAL HISTORY: Frequency of urination: every 1 hours Incontinence: Sneezing and horseback riding  Onset: 2021 Amount: Min/Mod Protective undergarments: Yes   Type: pantyliner; Number used/day: 1x/day Fluid Intake: ~120 oz H20, 16-20 oz coffee caffeinated Nocturia: 0x/night Incomplete emptying: Yes  Pain with urination: Negative Stream: Strong Urgency: No  Difficulty initiating urination: Negative Intermittent stream: Negative Frequent UTI: Negative.   GASTROINTESTINAL HISTORY: Type of bowel movement: (Bristol Stool Scale) 4 Frequency of BMs: 1x/day Incomplete bowel movement: No  Pain with defecation: Negative Straining with defecation: Negative  SEXUAL HISTORY AND FUNCTION: Sexually active: Yes  Pain with penetration: none Pain with external stimulation: No  Change in ability to achieve orgasm: No  Sexual abuse: No    OBJECTIVE:  DIAGNOSTIC TESTING/IMAGING: none  COGNITION:  Patient is oriented to person, place, and time.  Recent memory is intact.  Remote memory is intact.  Attention span and concentration are intact.  Expressive speech is intact.  Patient's fund of  knowledge is within normal limits for educational level.    POSTURE/OBSERVATIONS:   Lumbar lordosis: WNL Thoracic kyphosis: WNL Patient assumes some mild anterior pelvic tilt in standing.  Supine to sit transfer revealed rectus abdominis dominant strategy with some bulging, no coning/doming.    GAIT: Grossly WFL.    RANGE OF MOTION:   AROM (Normal range in degrees) AROM  01/12/2022  Lumbar   Flexion (65) WNL  Extension (30) WNL  Right lateral flexion (25) WNL  Left lateral flexion (25) WNL  Right rotation (30) WNL  Left rotation (30) WNL      Hip LEFT RIGHT  Flexion (125) WNL WNL  Extension (15)    Abduction (40) WNL WNL  Adduction     Internal Rotation (45) ~15 ^ ~ 15 ^  External Rotation (45) WNL WNL  (* = pain; blank rows = not tested; ^ visual estimate)   SENSATION:  Grossly intact to light touch bilateral LEs as determined by testing dermatomes L2-S2 Proprioception and hot/cold testing deferred on this date   STRENGTH: MMT    RLE LLE  Hip Flexion    Hip Extension    Hip Abduction     Hip Adduction     Hip ER  5 4  Hip IR  5 5  Knee Extension 5 5  Knee Flexion 5 5  Dorsiflexion  5 5  (* = pain; blank rows = deferred to next visit)   MUSCLE LENGTH:  Hamstrings: R: Negative L: Negative Adductors: R: Negative L: Negative  ABDOMINAL:   Palpation: no TTP Diastasis: none noted on testing Rib flare: none noted on testing   PHYSICAL PERFORMANCE MEASURES:  STS: WNL Deep Squat: RLE STS: LLE STS:  : 5TSTS:      TREATMENT- 02/09/2022 SUBJECTIVE: Patient denies any new concerns; was able to replicate some but not all of the interventions from last session. Notes she is more consistently feeling her abdominals engage with the activities. PAIN: 0/10  Pre-treatment assessment:  RLE LLE  Hip Flexion 5 5  Hip Extension 4 4  Hip Abduction  3+ 3+  Hip Adduction  3 3   Manual Therapy:   Neuromuscular Re-education: Postural control  interventions as follows:  Total gym/reformer knee stretch  Total gym/reformer star  Total gym/reformer horseback  [place] horseback   Therapeutic Exercise: Glute strengthening as follows:  TRX squat  TRX narrow squat  Bulgarian split squat  Deep squat pulses  End range hip flexion, hip abductions, GTB  Figure 4 stretch  Treatments unbilled:  Post-treatment assessment:  Patient educated throughout session on appropriate technique and form using multi-modal cueing, HEP, and activity modification. Patient articulated understanding and returned demonstration.  Patient Response to interventions:   PATIENT SURVEYS:  FOTO Urinary 67  ASSESSMENT:  Clinical Impression: Patient presents to clinic with excellent motivation to participate in therapy. Patient demonstrates deficits in PFM strength/coordination, TrA strength and control, and load management through the spine. Patient less reliant on tactile cueing for abdominal engagement during today's session and responded positively to both postural control and glute focused interventions. Patient will benefit from continued skilled therapeutic intervention to address remaining deficits in PFM strength/coordination, TrA strength and control, and load management through the spine in order to increase function and improve overall QOL.    Objective impairments: decreased activity tolerance, decreased balance, decreased coordination, decreased endurance, decreased strength, improper body mechanics, and postural dysfunction.   Activity limitations:  sporting activity .   Personal factors: Fitness, Past/current experiences, and Time since onset of injury/illness/exacerbation are also affecting patient's functional outcome.   Rehab Potential: Excellent  Clinical decision making: Stable/uncomplicated  Evaluation complexity: Low   GOALS: Goals reviewed with patient? Yes  SHORT TERM GOALS: Target date: 02/23/2022  Patient will  demonstrate independence with HEP in order to maximize therapeutic gains and improve carryover from physical therapy sessions to ADLs in the home and community. Baseline: [id number] Goal status: INITIAL  LONG TERM GOALS: Target date: 04/06/2022  Patient will demonstrate improved function as evidenced by a score of 74 on FOTO measure for full participation in activities at home and in the community.  Baseline: 67 Goal status: INITIAL  Patient will be able to perform 60-100 repetitions of TrA contraction in the presence of extremity challenge and resistance for improved postural endurance during sporting activity.  Baseline: 4 reps in quadruped Goal status: INITIAL  Patient will be able to articulate and demonstrate 3-5 postural strengthening interventions for improved independent management of postural control for sporting activity.    Baseline: not formally assessed  Goal status: INITIAL   PLAN: Rehab frequency: 1x/week  Rehab duration: 12 weeks  Planned interventions: Therapeutic exercises, Therapeutic activity, Neuromuscular re-education, Balance training, Gait training, Patient/Family education, Self Care, Joint mobilization, Orthotic/Fit training, Electrical stimulation, Spinal mobilization, Cryotherapy, Moist heat, Manual lymph drainage, scar mobilization, Taping, and Manual therapy     Myles Gip PT, DPT [phone]  02/09/2022, 4:10 PM

## 2022-02-16 ENCOUNTER — Ambulatory Visit: Payer: Managed Care, Other (non HMO) | Admitting: Physical Therapy

## 2022-02-23 ENCOUNTER — Ambulatory Visit: Payer: Managed Care, Other (non HMO) | Admitting: Physical Therapy

## 2022-02-23 ENCOUNTER — Encounter: Payer: Self-pay | Admitting: Physical Therapy

## 2022-02-23 DIAGNOSIS — M6281 Muscle weakness (generalized): Secondary | ICD-10-CM

## 2022-02-23 DIAGNOSIS — R293 Abnormal posture: Secondary | ICD-10-CM

## 2022-02-23 DIAGNOSIS — R29898 Other symptoms and signs involving the musculoskeletal system: Secondary | ICD-10-CM

## 2022-02-23 NOTE — Therapy (Signed)
OUTPATIENT PHYSICAL THERAPY FEMALE PELVIC TREATMENT   Patient Name: Tracey Logan MRN: [id number] DOB:08/21/1990, 31 y.o., female Today's Date: 02/23/2022   PT End of Session - 02/23/22 1522     Visit Number 5    Number of Visits 12    Date for PT Re-Evaluation 04/06/22    Authorization Type IE 01/12/2022    PT Start Time 1520    PT Stop Time 1600    PT Time Calculation (min) 40 min    Activity Tolerance Patient tolerated treatment well    Behavior During Therapy [facility] for tasks assessed/performed             History reviewed. No pertinent past medical history. Past Surgical History:  Procedure Laterality Date   WISDOM TOOTH EXTRACTION Bilateral    Patient Active Problem List   Diagnosis Date Noted   Encounter for planned induction of labor 07/14/2019   Twin pregnancy, twins dichorionic and diamniotic 07/11/2019   Twin pregnancy, monochorionic/diamniotic, third trimester 06/25/2019   Maternal care for restricted fetal growth, antepartum 06/16/2019   Monochorionic diamniotic twin gestation 12/30/2018   History of spontaneous abortion x2 12/30/2018    PCP: [facility]  REFERRING PROVIDER: Jerrel Ivory, DO  REFERRING DIAG: Abdominal weakness   THERAPY DIAG:  Muscle weakness (generalized)  Abnormal posture  Poor body mechanics  Rationale for Evaluation and Treatment Rehabilitation  PRECAUTIONS: None  WEIGHT BEARING RESTRICTIONS No  FALLS:  Has patient fallen in last 6 months? No  ONSET DATE: 2021  SUBJECTIVE:                                                                                                                                                                                           CHIEF COMPLAINT: Patient states that since delivering twins she has had issues. Patient competes in cowboy mounted shooting. Patient notes that with sporting activity she has some urinary leakage, but the main concern is postural instability.  Patient notes that she does utilize some calisthenics/BW training.    PERTINENT HISTORY/CHART REVIEW:  Red flags (bowel/bladder changes, saddle paresthesia, personal history of cancer, h/o spinal tumors, h/o compression fx, h/o abdominal aneurysm, abdominal pain, chills/fever, night sweats, nausea, vomiting, unrelenting pain, first onset of insidious LBP <19 y/o): Negative   PAIN:  Are you having pain? No   OCCUPATION/LEISURE ACTIVITIES:  Horseback riding   PLOF:  Independent  PATIENT GOALS: "Be able to connect with my core; to engage it. Stability."  OBSTETRICAL HISTORY: G4P2 Deliveries: SVD Tearing/Episiotomy:  Birthing position:  GYNECOLOGICAL HISTORY: Hysterectomy: No Vaginal/Abdominal Endometriosis: Negative Last Menstrual Period:  Pain with exam: No  Prolapse: None Heaviness/pressure: No   UROLOGICAL HISTORY: Frequency of urination: every 1 hours Incontinence: Sneezing and horseback riding  Onset: 2021 Amount: Min/Mod Protective undergarments: Yes   Type: pantyliner; Number used/day: 1x/day Fluid Intake: ~120 oz H20, 16-20 oz coffee caffeinated Nocturia: 0x/night Incomplete emptying: Yes  Pain with urination: Negative Stream: Strong Urgency: No  Difficulty initiating urination: Negative Intermittent stream: Negative Frequent UTI: Negative.   GASTROINTESTINAL HISTORY: Type of bowel movement: (Bristol Stool Scale) 4 Frequency of BMs: 1x/day Incomplete bowel movement: No  Pain with defecation: Negative Straining with defecation: Negative  SEXUAL HISTORY AND FUNCTION: Sexually active: Yes  Pain with penetration: none Pain with external stimulation: No  Change in ability to achieve orgasm: No  Sexual abuse: No    OBJECTIVE:  DIAGNOSTIC TESTING/IMAGING: none  COGNITION:  Patient is oriented to person, place, and time.  Recent memory is intact.  Remote memory is intact.  Attention span and concentration are intact.  Expressive speech is  intact.  Patient's fund of knowledge is within normal limits for educational level.    POSTURE/OBSERVATIONS:   Lumbar lordosis: WNL Thoracic kyphosis: WNL Patient assumes some mild anterior pelvic tilt in standing.  Supine to sit transfer revealed rectus abdominis dominant strategy with some bulging, no coning/doming.    GAIT: Grossly WFL.    RANGE OF MOTION:   AROM (Normal range in degrees) AROM  01/12/2022  Lumbar   Flexion (65) WNL  Extension (30) WNL  Right lateral flexion (25) WNL  Left lateral flexion (25) WNL  Right rotation (30) WNL  Left rotation (30) WNL      Hip LEFT RIGHT  Flexion (125) WNL WNL  Extension (15)    Abduction (40) WNL WNL  Adduction     Internal Rotation (45) ~15 ^ ~ 15 ^  External Rotation (45) WNL WNL  (* = pain; blank rows = not tested; ^ visual estimate)   SENSATION:  Grossly intact to light touch bilateral LEs as determined by testing dermatomes L2-S2 Proprioception and hot/cold testing deferred on this date   STRENGTH: MMT    RLE LLE  Hip Flexion    Hip Extension    Hip Abduction     Hip Adduction     Hip ER  5 4  Hip IR  5 5  Knee Extension 5 5  Knee Flexion 5 5  Dorsiflexion  5 5  (* = pain; blank rows = deferred to next visit)   MUSCLE LENGTH:  Hamstrings: R: Negative L: Negative Adductors: R: Negative L: Negative  ABDOMINAL:   Palpation: no TTP Diastasis: none noted on testing Rib flare: none noted on testing   PHYSICAL PERFORMANCE MEASURES:  STS: WNL Deep Squat: RLE STS: LLE STS:  : 5TSTS:      TREATMENT- 02/23/2022 SUBJECTIVE: Patient reports that she was able to ride some since last visit and felt improved in postural stability particularly on turns, but noted some instability in hips when riding.  PAIN: 0/10  Pre-treatment assessment:  RLE LLE  Hip Flexion 5 5  Hip Extension 4 4  Hip Abduction  3+ 3+  Hip Adduction  3 3   Manual Therapy:   Neuromuscular Re-education: Controlled  articular rotations of B hips for improved joint mobility and proprioception, B, x6 each direction Progressive/regressive angular isometrics for B hip rotation (internal and external)  Therapeutic Exercise: Glute strengthening as follows:  Lateral walking, BTB  Standing clamshell, BTB  Treatments unbilled:  Post-treatment assessment:  Patient educated throughout session  on appropriate technique and form using multi-modal cueing, HEP, and activity modification. Patient articulated understanding and returned demonstration.  Patient Response to interventions:   PATIENT SURVEYS:  FOTO Urinary 67  ASSESSMENT:  Clinical Impression: Patient presents to clinic with excellent motivation to participate in therapy. Patient demonstrates deficits in PFM strength/coordination, TrA strength and control, and load management through the spine. Patient participated in hip proprioceptive and mobility drills during today's session and responded positively to strengthening interventions as well. Patient will benefit from continued skilled therapeutic intervention to address remaining deficits in PFM strength/coordination, TrA strength and control, and load management through the spine in order to increase function and improve overall QOL.    Objective impairments: decreased activity tolerance, decreased balance, decreased coordination, decreased endurance, decreased strength, improper body mechanics, and postural dysfunction.   Activity limitations:  sporting activity .   Personal factors: Fitness, Past/current experiences, and Time since onset of injury/illness/exacerbation are also affecting patient's functional outcome.   Rehab Potential: Excellent  Clinical decision making: Stable/uncomplicated  Evaluation complexity: Low   GOALS: Goals reviewed with patient? Yes  SHORT TERM GOALS: Target date: 02/23/2022  Patient will demonstrate independence with HEP in order to maximize therapeutic  gains and improve carryover from physical therapy sessions to ADLs in the home and community. Baseline: [id number] Goal status: INITIAL  LONG TERM GOALS: Target date: 04/06/2022  Patient will demonstrate improved function as evidenced by a score of 74 on FOTO measure for full participation in activities at home and in the community.  Baseline: 67 Goal status: INITIAL  Patient will be able to perform 60-100 repetitions of TrA contraction in the presence of extremity challenge and resistance for improved postural endurance during sporting activity.  Baseline: 4 reps in quadruped Goal status: INITIAL  Patient will be able to articulate and demonstrate 3-5 postural strengthening interventions for improved independent management of postural control for sporting activity.    Baseline: not formally assessed  Goal status: INITIAL   PLAN: Rehab frequency: 1x/week  Rehab duration: 12 weeks  Planned interventions: Therapeutic exercises, Therapeutic activity, Neuromuscular re-education, Balance training, Gait training, Patient/Family education, Self Care, Joint mobilization, Orthotic/Fit training, Electrical stimulation, Spinal mobilization, Cryotherapy, Moist heat, Manual lymph drainage, scar mobilization, Taping, and Manual therapy     Sheria Lang PT, DPT [phone]  02/23/2022, 3:23 PM

## 2022-03-02 ENCOUNTER — Encounter: Payer: Self-pay | Admitting: Physical Therapy

## 2022-03-02 ENCOUNTER — Ambulatory Visit: Payer: Managed Care, Other (non HMO) | Admitting: Physical Therapy

## 2022-03-02 DIAGNOSIS — R29898 Other symptoms and signs involving the musculoskeletal system: Secondary | ICD-10-CM

## 2022-03-02 DIAGNOSIS — R293 Abnormal posture: Secondary | ICD-10-CM

## 2022-03-02 DIAGNOSIS — M6281 Muscle weakness (generalized): Secondary | ICD-10-CM | POA: Diagnosis not present

## 2022-03-02 NOTE — Therapy (Signed)
OUTPATIENT PHYSICAL THERAPY FEMALE PELVIC TREATMENT   Patient Name: Tracey Logan MRN: [id number] DOB:[birth year], 31 y.o., female Today's Date: 03/02/2022   PT End of Session - 03/02/22 1751     Visit Number 6    Number of Visits 12    Date for PT Re-Evaluation 04/06/22    Authorization Type IE 01/12/2022    PT Start Time 1515    PT Stop Time 1600    PT Time Calculation (min) 45 min    Activity Tolerance Patient tolerated treatment well    Behavior During Therapy [facility] for tasks assessed/performed             History reviewed. No pertinent past medical history. Past Surgical History:  Procedure Laterality Date   WISDOM TOOTH EXTRACTION Bilateral    Patient Active Problem List   Diagnosis Date Noted   Encounter for planned induction of labor 07/14/2019   Twin pregnancy, twins dichorionic and diamniotic 07/11/2019   Twin pregnancy, monochorionic/diamniotic, third trimester 06/25/2019   Maternal care for restricted fetal growth, antepartum 06/16/2019   Monochorionic diamniotic twin gestation 12/30/2018   History of spontaneous abortion x2 12/30/2018    PCP: [facility]  REFERRING PROVIDER: Diamond Nickel, DO  REFERRING DIAG: Abdominal weakness   THERAPY DIAG:  Muscle weakness (generalized)  Abnormal posture  Poor body mechanics  Rationale for Evaluation and Treatment Rehabilitation  PRECAUTIONS: None  WEIGHT BEARING RESTRICTIONS No  FALLS:  Has patient fallen in last 6 months? No  ONSET DATE: 2021  SUBJECTIVE:                                                                                                                                                                                           CHIEF COMPLAINT: Patient states that since delivering twins she has had issues. Patient competes in cowboy mounted shooting. Patient notes that with sporting activity she has some urinary leakage, but the main concern is postural instability.  Patient notes that she does utilize some calisthenics/BW training.    PERTINENT HISTORY/CHART REVIEW:  Red flags (bowel/bladder changes, saddle paresthesia, personal history of cancer, h/o spinal tumors, h/o compression fx, h/o abdominal aneurysm, abdominal pain, chills/fever, night sweats, nausea, vomiting, unrelenting pain, first onset of insidious LBP <20 y/o): Negative   PAIN:  Are you having pain? No   OCCUPATION/LEISURE ACTIVITIES:  Horseback riding   PLOF:  Independent  PATIENT GOALS: "Be able to connect with my core; to engage it. Stability."  OBSTETRICAL HISTORY: G4P2 Deliveries: SVD Tearing/Episiotomy:  Birthing position:  GYNECOLOGICAL HISTORY: Hysterectomy: No Vaginal/Abdominal Endometriosis: Negative Last Menstrual Period:  Pain with exam: No  Prolapse: None Heaviness/pressure: No   UROLOGICAL HISTORY: Frequency of urination: every 1 hours Incontinence: Sneezing and horseback riding  Onset: 2021 Amount: Min/Mod Protective undergarments: Yes   Type: pantyliner; Number used/day: 1x/day Fluid Intake: ~120 oz H20, 16-20 oz coffee caffeinated Nocturia: 0x/night Incomplete emptying: Yes  Pain with urination: Negative Stream: Strong Urgency: No  Difficulty initiating urination: Negative Intermittent stream: Negative Frequent UTI: Negative.   GASTROINTESTINAL HISTORY: Type of bowel movement: (Bristol Stool Scale) 4 Frequency of BMs: 1x/day Incomplete bowel movement: No  Pain with defecation: Negative Straining with defecation: Negative  SEXUAL HISTORY AND FUNCTION: Sexually active: Yes  Pain with penetration: none Pain with external stimulation: No  Change in ability to achieve orgasm: No  Sexual abuse: No    OBJECTIVE:  DIAGNOSTIC TESTING/IMAGING: none  COGNITION:  Patient is oriented to person, place, and time.  Recent memory is intact.  Remote memory is intact.  Attention span and concentration are intact.  Expressive speech is  intact.  Patient's fund of knowledge is within normal limits for educational level.    POSTURE/OBSERVATIONS:   Lumbar lordosis: WNL Thoracic kyphosis: WNL Patient assumes some mild anterior pelvic tilt in standing.  Supine to sit transfer revealed rectus abdominis dominant strategy with some bulging, no coning/doming.    GAIT: Grossly WFL.    RANGE OF MOTION:   AROM (Normal range in degrees) AROM  01/12/2022  Lumbar   Flexion (65) WNL  Extension (30) WNL  Right lateral flexion (25) WNL  Left lateral flexion (25) WNL  Right rotation (30) WNL  Left rotation (30) WNL      Hip LEFT RIGHT  Flexion (125) WNL WNL  Extension (15)    Abduction (40) WNL WNL  Adduction     Internal Rotation (45) ~15 ^ ~ 15 ^  External Rotation (45) WNL WNL  (* = pain; blank rows = not tested; ^ visual estimate)   SENSATION:  Grossly intact to light touch bilateral LEs as determined by testing dermatomes L2-S2 Proprioception and hot/cold testing deferred on this date   STRENGTH: MMT    RLE LLE  Hip Flexion    Hip Extension    Hip Abduction     Hip Adduction     Hip ER  5 4  Hip IR  5 5  Knee Extension 5 5  Knee Flexion 5 5  Dorsiflexion  5 5  (* = pain; blank rows = deferred to next visit)   MUSCLE LENGTH:  Hamstrings: R: Negative L: Negative Adductors: R: Negative L: Negative  ABDOMINAL:   Palpation: no TTP Diastasis: none noted on testing Rib flare: none noted on testing   PHYSICAL PERFORMANCE MEASURES:  STS: WNL Deep Squat: RLE STS: LLE STS:  6MWT: 5TSTS:      TREATMENT- 02/23/2022 SUBJECTIVE: Patient reports that she was able to ride some since last visit and felt improved in postural stability particularly on turns, but noted some instability in hips when riding.  PAIN: 0/10  Pre-treatment assessment:  RLE LLE  Hip Flexion 5 5  Hip Extension 4 4  Hip Abduction  3+ 3+  Hip Adduction  3 3   Manual Therapy:   Neuromuscular Re-education: Patient  participated in Pilates-based session utilizing tower and chair exercises for improved postural control with spinal flexion, extension, lateral flexion components.  Therapeutic Exercise:   Treatments unbilled:  Post-treatment assessment:  Patient educated throughout session on appropriate technique and form using multi-modal cueing, HEP, and activity modification. Patient articulated understanding and  returned demonstration.  Patient Response to interventions:   PATIENT SURVEYS:  FOTO Urinary 67  ASSESSMENT:  Clinical Impression: Patient presents to clinic with excellent motivation to participate in therapy. Patient demonstrates deficits in PFM strength/coordination, TrA strength and control, and load management through the spine. Patient with good form and subjective benefit with Pilates based postural control interventions during today's session. Patient will benefit from continued skilled therapeutic intervention to address remaining deficits in PFM strength/coordination, TrA strength and control, and load management through the spine in order to increase function and improve overall QOL.    Objective impairments: decreased activity tolerance, decreased balance, decreased coordination, decreased endurance, decreased strength, improper body mechanics, and postural dysfunction.   Activity limitations:  sporting activity .   Personal factors: Fitness, Past/current experiences, and Time since onset of injury/illness/exacerbation are also affecting patient's functional outcome.   Rehab Potential: Excellent  Clinical decision making: Stable/uncomplicated  Evaluation complexity: Low   GOALS: Goals reviewed with patient? Yes  SHORT TERM GOALS: Target date: 02/23/2022  Patient will demonstrate independence with HEP in order to maximize therapeutic gains and improve carryover from physical therapy sessions to ADLs in the home and community. Baseline: [id number]; 12/28: IND Goal  status: MET  LONG TERM GOALS: Target date: 04/06/2022  Patient will demonstrate improved function as evidenced by a score of 74 on FOTO measure for full participation in activities at home and in the community.  Baseline: 67 Goal status: INITIAL  Patient will be able to perform 60-100 repetitions of TrA contraction in the presence of extremity challenge and resistance for improved postural endurance during sporting activity.  Baseline: 4 reps in quadruped Goal status: INITIAL  Patient will be able to articulate and demonstrate 3-5 postural strengthening interventions for improved independent management of postural control for sporting activity.    Baseline: not formally assessed  Goal status: INITIAL   PLAN: Rehab frequency: 1x/week  Rehab duration: 12 weeks  Planned interventions: Therapeutic exercises, Therapeutic activity, Neuromuscular re-education, Balance training, Gait training, Patient/Family education, Self Care, Joint mobilization, Orthotic/Fit training, Electrical stimulation, Spinal mobilization, Cryotherapy, Moist heat, Manual lymph drainage, scar mobilization, Taping, and Manual therapy     Myles Gip PT, DPT [phone]  03/02/2022, 5:52 PM

## 2022-05-18 ENCOUNTER — Encounter: Payer: Managed Care, Other (non HMO) | Admitting: Physical Therapy
